# Patient Record
Sex: Female | Born: 1955 | Race: White | Hispanic: No | Marital: Married | Smoking: Never smoker
Health system: Southern US, Community
[De-identification: ages and names within clinical notes are randomized; demographics above are authoritative.]

## PROBLEM LIST (undated history)

## (undated) DIAGNOSIS — E119 Type 2 diabetes mellitus without complications: Secondary | ICD-10-CM

## (undated) DIAGNOSIS — I10 Essential (primary) hypertension: Secondary | ICD-10-CM

## (undated) DIAGNOSIS — Z992 Dependence on renal dialysis: Secondary | ICD-10-CM

## (undated) DIAGNOSIS — E13319 Other specified diabetes mellitus with unspecified diabetic retinopathy without macular edema: Secondary | ICD-10-CM

## (undated) DIAGNOSIS — S82891A Other fracture of right lower leg, initial encounter for closed fracture: Secondary | ICD-10-CM

## (undated) DIAGNOSIS — D631 Anemia in chronic kidney disease: Secondary | ICD-10-CM

## (undated) DIAGNOSIS — N189 Chronic kidney disease, unspecified: Secondary | ICD-10-CM

## (undated) DIAGNOSIS — N2581 Secondary hyperparathyroidism of renal origin: Secondary | ICD-10-CM

## (undated) DIAGNOSIS — N186 End stage renal disease: Secondary | ICD-10-CM

## (undated) DIAGNOSIS — L97519 Non-pressure chronic ulcer of other part of right foot with unspecified severity: Secondary | ICD-10-CM

## (undated) HISTORY — PX: ORIF ANKLE FRACTURE: SUR919

## (undated) HISTORY — PX: OTHER SURGICAL HISTORY: SHX169

---

## 2006-01-10 ENCOUNTER — Inpatient Hospital Stay (HOSPITAL_COMMUNITY): Admission: AD | Admit: 2006-01-10 | Discharge: 2006-01-20 | Payer: Self-pay | Admitting: Nephrology

## 2006-01-11 ENCOUNTER — Encounter (INDEPENDENT_AMBULATORY_CARE_PROVIDER_SITE_OTHER): Payer: Self-pay | Admitting: *Deleted

## 2006-08-31 ENCOUNTER — Ambulatory Visit (HOSPITAL_COMMUNITY): Admission: RE | Admit: 2006-08-31 | Discharge: 2006-08-31 | Payer: Self-pay | Admitting: Vascular Surgery

## 2006-08-31 HISTORY — PX: AV FISTULA PLACEMENT: SHX1204

## 2006-09-05 ENCOUNTER — Ambulatory Visit (HOSPITAL_COMMUNITY): Admission: RE | Admit: 2006-09-05 | Discharge: 2006-09-05 | Payer: Self-pay | Admitting: Vascular Surgery

## 2006-09-05 HISTORY — PX: ARTERIOVENOUS GRAFT PLACEMENT: SUR1029

## 2008-11-24 ENCOUNTER — Ambulatory Visit (HOSPITAL_COMMUNITY): Admission: RE | Admit: 2008-11-24 | Discharge: 2008-11-24 | Payer: Self-pay | Admitting: Nephrology

## 2011-04-28 NOTE — Discharge Summary (Signed)
Connie Christian, Connie Christian               ACCOUNT NO.:  [id number]   MEDICAL RECORD NO.:  [id number]          PATIENT TYPE:  INP   LOCATION:  5531                         FACILITY:  [facility]   PHYSICIAN:  Aram Beecham B. Eliott [name], M.D.DATE OF BIRTH:  June 03, 1956   DATE OF ADMISSION:  01/10/2006  DATE OF DISCHARGE:  01/20/2006                                 DISCHARGE SUMMARY   DISCHARGE DIAGNOSES:  1.  End-stage renal disease, new hemodialysis patient.  2.  Insulin-dependent diabetes mellitus.  3.  Hypertension.  4.  __________ .  5.  Right heel pressure ulcer with positive Staphylococcus aureus __________      .  6.  Anemia.   DISCHARGE MEDICATIONS:  1.  __________ b.i.d. with meals.  2.  Epogen 5000 units IV three times a week with hemodialysis on Tuesday,      Thursday, and Saturday.  3.  Hectorol 1 mcg IV with hemodialysis on Tuesday, Thursday, and Saturday.  4.  __________ 100 mg p.o. b.i.d. for seven days.  5.  Insulin 70/30 subcu, 30 units in the a.m. and 20 units in the evening      with meals.  6.  Venofer 50 mg IV weekly with hemodialysis, start on March 7th;  7.  Venofer 100 mg IV three times a week with hemodialysis on Tuesday,      Thursday, and Saturday; stop February 28th.  8.  Metoprolol 50 mg p.o. b.i.d.  9.  Nephro-Vite one tab p.o. daily.  10. Sorbitol 60 ml daily p.r.n.   DISCHARGE INSTRUCTIONS:  The patient needs wet-to-dry dressing changes in  the right heel daily. She was instructed to follow up at the [facility] on Tuesday, Thursday, and Saturday for hemodialysis, [phone].  Next appointment on Tuesday, January 23, 2006, at 10 a.m. She also was  instructed to follow up with Dr. Hart Rochester from [facility] on March 27th at 9:40  a.m., phone number [phone]. The patient was recommended to follow a  diabetic diet, carbohydrate modified, and renal diet with 7 gm of protein, 2-  gm sodium, 2-gm potassium, and fluid restriction to 1200 mL daily. The  patient is  also to have home health aide for dressing changes and for home  health PT/OT. Arrangements have been made prior to discharge for a social  worker to coordinate this at the [facility].   PROCEDURES AND STUDIES:  The patient had an IJ Diatek PermCath placed on the  right side on January 11, 2006. She had a new ABF placed on the left side on  January 17, 2006. The patient received hemodialysis treatment on February  2nd, February 3rd, February 5th, February 6th, February 8th, and February  10th. Chest x-ray on January 31st showed cardiomegaly and vascular  congestion with bilateral effusions. Preview x-ray of the right foot showed  chronic changes, extensive degenerative change of the ankle joint,  osteopenia, postoperative changes, and no evidence of osteomyelitis.  Renal  ultrasound on February 1st showed no hydronephrosis, increased cortical  echogenicity compatible with medial renal parenchymal disease. A 2-D  echocardiogram on February 1st showed  left ventricular systolic function  normal with ventricular ejection fraction between 55% and 60%. Abnormal  relaxation of the left ventricle, mild mitral valve regurgitation,  atrioseptal aneurysm visualized, right ventricle mildly dilated with a right  ventricular systolic pressure between 50% and 55%.  The inferior vena cava  was mildly dilated.  Chest x-ray on February 4th showed stable cardiomegaly  and improved bilateral effusion with no overt interstitial edema.   BRIEF HOSPITAL COURSE:  Connie Christian is a 55 year old female who was  transferred from [facility] on January 10, 2006, with volume  overload and anasarca, in renal failure. The patient had a history of poorly  controlled diabetes and hypertension. She was followed by Dr. Jeanie Sewer in  [place] with poor compliance with her medications. She also has a history  of ankle fracture which was repaired two years prior and with poor healing.  She had a creatinine of  1.7 in 2005. She presented on January 30th to  [facility] with large right pleural effusion and generalized  anasarca. BUN 142, creatinine 9.4, and a hemoglobin of 9.1. Albumin of 2.8.  She was transferred to [facility] for further management.  Problem #1:  Renal failure. The patient responded fairly to IV diuretics  given at [facility]. On the 31st she had a PermCath placed and she  started hemodialysis on February 1st. She had a total of seven hemodialysis  treatments in which she had a UF of 5 liters each time, tolerating the  hemodialysis very well. She had an arteriovenous fistula performed on the  left arm on February 7th. She clinically improved ostensibly with the  hemodialysis reducing her lower extremity edema from 3+ to 1+ edema on  discharge. She also cleared her lungs and on discharge day she had lungs  that were clear to auscultation bilaterally, with no crackles. The patient  was admitted with a weight of 87 kg and she was discharged with a weight of  66.6 kg, so down 20.4 kg during this hospital stay. She was admitted with  hypertension and she was started on Norvasc and metoprolol. Her Norvasc was  discontinued since her pressures began to improve with hemodialysis. She  continues to have increased systolic blood pressures, and she still has  volume overload. She had an estimated a direct wave and 63 kg, but needs to  be challenged lightly. She can go further down 63 kg. She is to follow up at  the [facility] for hemodialysis on Tuesday, February 13th, at  10 a.m.  Her hemodialysis orders are four hours via PermCath in the right  side 400/800 heparin, 4K with 2.5 calcium bath, UF 3-4 liters, and the  patient would need calcium weekly checks. The patient had __________ .   __________ hyperglycemic with glucose levels ranging to 300. Her insulin regime was upsized and she was discharged on 70/30, 30units in the morning  and 20 units in  the evening. __________ 88 to 97.  __________ the patient  was __________ with a hemoglobin of 8.5, __________ down to 7.9.  She  received two units of packed red blood cells on February 8th. On discharge,  the patient's hemoglobin was 11.1.  She was started on __________ and  continued to follow with a dose of 5000 units three times a week, on  Tuesday, Thursday, and Saturday. The patient had iron studies that showed  iron of 67, iron-binding capacity of 247, percent saturation of 27. She was  __________ .  Her hemoglobin is to be followed as an outpatient.  __________  end-stage renal disease. This is likely secondary to diabetes,  microangiography.   Right heel pressure sore. The patient was followed by wound care while she  was at the __________ Hospital. They recommended follow-up dressing changes.  Her __________ was altered and __________ started on vancomycin __________  b.i.d. She is to complete a total of 14 days __________   DISCHARGE LABORATORY:  White blood cell count 16.2, hemoglobin 11.1,  hematocrit 36.0, platelet count 147,000. Sodium 136, potassium 3.8, chloride  101, CO2 26, BUN 46, creatinine __________ , glucose 47, calcium 7.9,  phosphorus 2.9, albumin 2.   __________ right heel pressure sore. The patient , despite growing  Staphylococcus aureus in the right heel, she remained afebrile. However, the  last discharge day in the hospital she had a complete white blood count  trended down __________ . Also, __________ white blood cell count could be  related to __________ and a normal urinalysis. The patient __________ .   DISPOSITION:  The patient was discharged home in improved and stable  condition to follow up at the [facility] on Monday at 10 a.m.  for hemodialysis, phone numbers have been provided. All the pertinent  information needs to be faxed to the kidney center.      Sharin Grave, MD    ______________________________  Duke Salvia  Eliott [name], [name]    AM/MEDQ  D:  01/20/2006  T:  01/21/2006  Job:  [id number]   cc:   [facility]   Dr. Gwyneth Sprout, [employer]   Maree Krabbe, [name]  Fax: [phone]

## 2011-04-28 NOTE — Op Note (Signed)
Connie Christian, Connie Christian               ACCOUNT NO.:  [id number]   MEDICAL RECORD NO.:  [id number]           PATIENT TYPE:   LOCATION:                                 FACILITY:   PHYSICIAN:  Quita Skye. Hart Rochester, [name]       DATE OF BIRTH:   DATE OF PROCEDURE:  01/17/2006  DATE OF DISCHARGE:                                 OPERATIVE REPORT   PREOP DIAGNOSIS:  End-stage renal disease.   PO STOP DIAGNOSIS:  End-stage renal disease.   OPERATION:  Creation of a left brachial artery to cephalic vein upper arm AV  fistula (Kauffman shunt).   SURGEON:  Quita Skye. Hart Rochester, [name]   FIRST ASSISTANT:  Coral Ceo, P.A.   ANESTHESIA:  Local.   DESCRIPTION OF PROCEDURE:  The patient was taken to the operating room,  placed in the supine position, at which time the left upper extremity was  prepped with Betadine scrub and solution, and draped in a routine sterile  manner. After infiltration with 1% Xylocaine with epinephrine, a transverse  incision was made in the antecubital area and the antecubital vein dissected  free. The cephalic vein was mobilized, its branches ligated with 3-0 silk  ties, and divided. It was a 3-3.5 mm vein of good quality; and could be  dilated up to the shoulder level. Brachial artery was then exposed beneath  the fascia. There was an aberrant radial artery which was preserved arising  proximal to the antecubital area which had a good pulse as well. The  brachial artery was then occluded proximally and distally with vessel loops,  opened with a 15-blade and extended with the Potts scissors. There was  excellent inflow. The vein was carefully measured and spatulated and  anastomosed end-to-side with 6-0 Prolene. The vessel loops were then  released; and there was a palpable pulse and thrill in the fistula with  excellent Doppler flow. There was also good Doppler flow in the radial and  ulnar artery with the fistula open. Adequate hemostasis was achieved; and  the wound was  closed with Vicryl in a subcuticular fashion. Sterile dressing  applied. The patient taken to recovery in satisfactory condition.           ______________________________  Quita Skye Hart Rochester, [name]     JDL/MEDQ  D:  01/17/2006  T:  01/17/2006  Job:  [id number]

## 2011-04-28 NOTE — Op Note (Signed)
NAMETAMICO, MUNDO               ACCOUNT NO.:  [id number]   MEDICAL RECORD NO.:  [id number]          PATIENT TYPE:  AMB   LOCATION:  SDS                          FACILITY:  [facility]   PHYSICIAN:  Di Kindle. Edilia Bo, M.D.DATE OF BIRTH:  Nov 25, 1956   DATE OF PROCEDURE:  08/31/2006  DATE OF DISCHARGE:                                 OPERATIVE REPORT   PREOPERATIVE DIAGNOSIS:  Poorly functioning left upper arm arteriovenous  fistula.   POSTOPERATIVE DIAGNOSIS:  Poorly functioning left upper arm arteriovenous  fistula.   PROCEDURE:  Conversion of left upper arm arteriovenous fistula to a left  forearm arteriovenous graft.   SURGEON:  Di Kindle. Edilia Bo, [name]   ASSISTANT:  Constance Holster, P.A.-C.   ANESTHESIA:  Local with sedation.   TECHNIQUE:  The patient was taken to the operating room and sedated by  anesthesia.  The left upper extremity was prepped and draped in the usual  sterile fashion.  After the skin was anesthetized with 1% lidocaine, an  incision at the antecubital level was opened and the AV fistula was  dissected free.  The vein was small proximally and then became larger  further centrally.  The artery was dissected free above and below the  anastomosis.  Of note, the artery was somewhat small but had an excellent  pulse.  A 4-7 mm graft was then tunneled in a loop fashion in the forearm  using one distal counter incision.  The arterial aspect of the graft was on  the radial aspect of the forearm, the venous aspect of the graft was on the  ulnar aspect of the forearm, and the distal aspect of the venous end of the  graft crossed over the arterial end to get to the cephalic vein.  The  patient was heparinized.  The brachial artery was clipped proximally and  distally and the vein was removed from the artery.  The arteriotomy was not  extended, at all.  A 6 mm 4 x 7 graft was spatulated, leaving as much as I  could of the 4 mm end of the graft to try to prevent  steal. This was sewn  end-to-side to the brachial artery using continuous 6-0 Prolene suture.  The  graft was then pulled the appropriate length for anastomosis to the cephalic  vein.  The vein had been ligated distally and spatulated proximally graft.  The graft was cut at appropriate length, spatulated, and sewn end-to-end to  the vein using continuous 6-0 Prolene suture.  At completion, there was an  excellent thrill in the graft.  Hemostasis was obtained in the wounds.  The  wounds were closed with a deep layer of 3-0 Vicryl.  The skin was closed  with 4-0 Vicryl.  A sterile dressing was applied.  The patient tolerated the  procedure well and was transferred to the recovery room in satisfactory  condition.  All needle and sponge counts were correct.      Di Kindle. Edilia Bo, [name]  Electronically Signed     CSD/MEDQ  D:  08/31/2006  T:  09/02/2006  Job:  [id number] 

## 2011-04-28 NOTE — Op Note (Signed)
NAMEDANALY, Connie Christian               ACCOUNT NO.:  [id number]   MEDICAL RECORD NO.:  [id number]          PATIENT TYPE:  AMB   LOCATION:  SDS                          FACILITY:  [facility]   PHYSICIAN:  Larina Earthly, [name]    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   PROCEDURE:  Removal of left arm AV Gore-Tex graft.   SURGEON:  Dr. Tawanna Cooler Early   ASSISTANT:  Zadie Rhine, PA-C.   ANESTHESIA:  MAC.   COMPLICATIONS:  None.   DISPOSITION:  To recovery room stable.   INDICATION FOR PROCEDURE:  The patient is a 55 year old white female, who  had a poorly functioning left upper arm AV fistula.  On September 21, she  had placement of a left forearm loop graft.  This was a 4-7 mm tapered  graft.  The patient presented to our office the day prior to this procedure  in the afternoon with numbness, pain, and obvious steal syndrome of her left  hand.  She is taken to the operating room this morning for ligation and  probable removal.   PROCEDURE IN DETAIL:  The patient was taken to the operating room and placed  in the supine position where the area of the left arm prepped and draped in  the usual sterile fashion.  Using local anesthesia, the antecubital wound  was reopened.  The graft was sewn end-to-end to the cephalic vein, and the  vein was ligated, and the graft was occluded and removed from the vein.  The  graft was doubly ligated near the arterial anastomosis, transected near the  arterial anastomosis.  The small cuff of graft was left on the artery, and  otherwise the graft was removed in its entirety.  Wounds were irrigated with  saline, hemostasis electrocautery.  Wounds were closed with 3-0 Vicryl in  the  subcutaneous, subcuticular tissue.  Benzoin and Steri-Strips were  applied.      Larina Earthly, [name]  Electronically Signed     TFE/MEDQ  D:  09/05/2006  T:  09/06/2006  Job:  [id number]

## 2011-04-28 NOTE — H&P (Signed)
NAMECARYL, Christian NO.:  [id number]   MEDICAL RECORD NO.:  [id number]          PATIENT TYPE:  INP   LOCATION:  2110                         FACILITY:  [facility]   PHYSICIAN:  Aram Beecham B. Eliott [name], M.D.DATE OF BIRTH:  [birth year]   DATE OF ADMISSION:  01/10/2006  DATE OF DISCHARGE:                                HISTORY & PHYSICAL   HISTORY OF PRESENT ILLNESS:  This is a 55 year old white female who was  transferred here from [facility] with volume overload, anasarca, and  renal failure.  She has been followed over the years by Dr. Jeanie Christian in  [place] with a history of long-standing diabetes, poorly controlled by her  admission, hypertension, for which she did not take medication on a regular  basis, and a history of a right ankle fracture (Dr. Thedore Mins).  The only  creatinine data we have is from 2005, when serum creatinine was reportedly  1.7.  She presented to [facility] on January 09, 2006, with several  weeks of swelling, a large necrotic right heel ulcer, a large right pleural  effusion, and generalized anasarca.  Her labs were notable for a BUN of 142,  creatinine of 9.4, hemoglobin of 9.1, albumin of 2.8, and a bicarbonate of  12.  Chest x-ray shows a large right pleural effusion.  Because of her renal  failure, she was transferred here for further management.  She did receive  IV Lasix every six hours while she was at [facility], although urine  outputs are not available.  Apparently, no renal ultrasound was done.   The patient herself says she has been swelling only for a couple of weeks.  She had some nausea and vomiting a few weeks back that she attributed to an  inner ear problem, but says that this has resolved and her appetite has been  good.  She has not noticed a change in urine output, no dysuria, hematuria,  back pain, or flank pain.  She reports no episodes of hypoglycemia on her  usual doses of 12 to 20 units of 70/30 insulin,  but it should be noted  that she does not regularly check her blood sugars.  She has had significant  dyspnea on exertion and swelling of her lower extremities and abdomen.  She  denies chest pain.  She has had no other GI symptoms.   PAST MEDICAL HISTORY:  1.  IDDM with retinopathy, status post laser therapy (the patient admits to      poor compliance and not regularly checking blood sugars).  2.  Hypertension, long-standing.  She had medications prescribed at one      point in the past, but did not take them because they did not agree with      her.  3.  History of right ankle fracture with poor healing (Dr. Thedore Mins).  4.  History of anemia with prior transfusion in the past (around the time of      menopause;  she states that she had a negative colonoscopy about that      time).  5.  Poor medical compliance.  6.  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  7.  Vancomycin allergy (blistering skin reaction), and penicillin allergy      (rash).  8.  History of pneumonia.   OUTPATIENT MEDICATIONS PRIOR TO ADMISSION:  1.  Insulin 70/30 12 to 20 units b.i.d.  2.  Benadryl p.r.n.   MEDICATIONS AT TIME OF TRANSFER:  1.  Sliding scale insulin.  2.  Rocephin 1 g q.24h.  3.  Lasix 100 mg q.6h.  4.  Metoprolol 25 mg p.o. b.i.d.  5.  Tylenol p.r.n.  6.  Phenergan p.r.n.  7.  Alprazolam p.r.n.  8.  Laxative of choice p.r.n.   FAMILY HISTORY:  Positive for diabetes.  Negative for hypertension, coronary  disease, or renal failure.   SOCIAL HISTORY:  The patient is unmarried and lives with her mother in  [place], [place].  She has no children.  She does not smoke or drink.  She has  been transfused in the past.   REVIEW OF SYSTEMS:  As per history of present illness.   PHYSICAL EXAMINATION:  GENERAL:  She is a very pale, pasty-appearing, white  female who is mildly dyspneic.  She has generalized anasarca.  VITAL SIGNS:  Blood  pressure 140/76, heart rate 67.  She is in sinus rhythm.  O2 saturation 98% on room air.  HEENT:  Pupils equal, round, reactive to light and accommodation.  NECK:  JVP approximately 6 cm.  LUNGS:  Breath sounds were markedly diminished, and in fact, could only be  heard at the apex on the right chest which was dull to percussion all the  way up to the scapula.  Breath sounds on the left were clear, but were  diminished at the base.  CARDIAC:  S1 and S2.  No S3, no pericardial rub.  ABDOMEN:  Positive bowel sounds.  There was no tenderness and no palpable  organomegaly.  She did have pitting edema of the abdominal wall.  EXTREMITIES:  4+ edema.  There was a necrotic right heel ulcer on the right  foot.  She had a deformed right ankle from her previous fracture.  Pulses  were trace dorsalis pedis and I could not feel posterior tibial's.  There  was a weeping skin ____________on the right leg.  No asterixis.   LABORATORY DATA:  X-rays:  EKG, etc., are all pending from this admission.  From [facility], pertinent labs included BUN of 147, creatinine 9.4,  potassium 5.7, hemoglobin 9.1, WBC 12,000.  Chest x-ray shows a large right  pleural effusion.   IMPRESSION:  A 55 year old white female with a history of poorly controlled  diabetes and hypertension with:  1.  Renal failure:  Creatinine was 1.7 in 2005.  There is no interim data.      This may simply represent the natural progression of disease.  There is      no history of any prior workup.  She needs dialysis and ultrafiltration      as we look for potential reversibility.  Our plan will be to start her      on intravenous diuretics tonight, have Perm-Cath placed in the morning      for dialysis and ultrafiltration, check urinalysis, urine protein-to-      creatinine ratio, renal ultrasound, SPEP, and UPEP.  We will save her     non-dominant arm, map her veins for possible AV access in  the future,      and place a Foley  catheter.  2.  Insulin-dependent diabetes mellitus:  Continue insulin and Accu-Chek's.  3.  Hypertension:  Continue metoprolol.  4.  Necrotic heel ulcer:  We will check a plain x-ray to rule out      osteomyelitis and check Doppler's to evaluate the adequacy of her      circulation.  We will use quinolone antibiotics empirically to cover      this as she is allergic to vancomycin and to penicillin.  5.  Anemia:  We will check iron studies, and pending those results, she will      likely need Aranesp or Epogen plus or minus iron.  6.  Large right pleural effusion:  This may require thoracentesis, but      hopefully with volume removal on dialysis,      this will diminish.  7.  Generalized anasarca:  I suspect this is all just volume overload      secondary to renal failure, but we will check a 2-D echocardiogram to      evaluate left ventricular function.           ______________________________  Duke Salvia Eliott [name], [name]     CBD/MEDQ  D:  01/10/2006  T:  01/10/2006  Job:  [id number]

## 2011-04-28 NOTE — Op Note (Signed)
Connie Christian, Connie Christian               ACCOUNT NO.:  [id number]   MEDICAL RECORD NO.:  [id number]          PATIENT TYPE:  INP   LOCATION:  5531                         FACILITY:  [facility]   PHYSICIAN:  Janetta Hora. Fields, MD  DATE OF BIRTH:  07/15/1956   DATE OF PROCEDURE:  01/11/2006  DATE OF DISCHARGE:                                 OPERATIVE REPORT   PROCEDURE:  Right neck ultrasound with insertion of right internal jugular  vein Diatek catheter.   PREOPERATIVE DIAGNOSIS:  Renal failure.   POSTOPERATIVE DIAGNOSIS:  Renal failure.   ANESTHESIA:  Local with IV sedation.   ASSISTANT:  Nurse.   OPERATIVE FINDINGS:  A 23-cm Diatek catheter, right internal jugular vein.   OPERATIVE DETAILS:  After obtaining informed consent, the patient was taken  to the operating room.  The patient was placed in supine position on the  operating table.  After adequate sedation, the patient's neck was inspected  with the Site-Rite ultrasound.  The right internal jugular vein was found be  widely patent with normal compression and respiratory variation.  Next the  patient's entire neck and chest were prepped and draped int he usual sterile  fashion.  Local anesthesia was infiltrated over the right internal jugular  vein.  Ultrasound guidance was used to cannulate the right internal jugular  vein.  A 0.035 J-tip guidewire was then threaded through the right internal  jugular vein down to the inferior vena cava under fluoroscopic guidance.  Next sequential 12, 14 and 16-French dilators wee placed over the guidewire  in the right atrium.  A 16-French with dilator peel-away sheath was placed  over the guidewire in the right atrium.  The dilator and guidewire were  removed and a 23-cm Diatek catheter was threaded through the peel-away  sheath down to the right atrium. The peel-away sheath was then removed.  The  catheter was tunneled subcutaneously, cut to length and the hub attached.  The catheter was noted  to flush and draw easily.  The catheter was inspected  under fluoroscopy, found with its tip in the right atrium and no kinks  throughout its course.  The catheter sutured to the skin with nylon sutures.  The neck insertion site was closed with a Vicryl stitch.  The patient  tolerated the procedure well, and there were no complications.  Instrument,  sponge and needle count were correct at the end of the  case.  The patient  was taken to the recovery room in stable condition.           ______________________________  Janetta Hora Fields, MD     CEF/MEDQ  D:  01/11/2006  T:  01/11/2006  Job:  [id number]

## 2012-11-05 ENCOUNTER — Encounter (HOSPITAL_COMMUNITY): Payer: Self-pay

## 2012-11-05 ENCOUNTER — Other Ambulatory Visit (HOSPITAL_COMMUNITY): Payer: Self-pay | Admitting: Nephrology

## 2012-11-05 ENCOUNTER — Ambulatory Visit (HOSPITAL_COMMUNITY)
Admission: RE | Admit: 2012-11-05 | Discharge: 2012-11-05 | Disposition: A | Discharge status: Home / Self Care | Payer: Medicare Other | Source: Ambulatory Visit | Attending: Nephrology | Admitting: Nephrology

## 2012-11-05 DIAGNOSIS — D631 Anemia in chronic kidney disease: Secondary | ICD-10-CM | POA: Insufficient documentation

## 2012-11-05 DIAGNOSIS — N186 End stage renal disease: Secondary | ICD-10-CM

## 2012-11-05 DIAGNOSIS — I120 Hypertensive chronic kidney disease with stage 5 chronic kidney disease or end stage renal disease: Secondary | ICD-10-CM | POA: Insufficient documentation

## 2012-11-05 DIAGNOSIS — N039 Chronic nephritic syndrome with unspecified morphologic changes: Secondary | ICD-10-CM | POA: Insufficient documentation

## 2012-11-05 DIAGNOSIS — N2581 Secondary hyperparathyroidism of renal origin: Secondary | ICD-10-CM | POA: Insufficient documentation

## 2012-11-05 DIAGNOSIS — E119 Type 2 diabetes mellitus without complications: Secondary | ICD-10-CM | POA: Insufficient documentation

## 2012-11-05 DIAGNOSIS — Z4901 Encounter for fitting and adjustment of extracorporeal dialysis catheter: Secondary | ICD-10-CM | POA: Insufficient documentation

## 2012-11-05 HISTORY — DX: Essential (primary) hypertension: I10

## 2012-11-05 HISTORY — DX: Anemia in chronic kidney disease: D63.1

## 2012-11-05 HISTORY — DX: Secondary hyperparathyroidism of renal origin: N25.81

## 2012-11-05 HISTORY — DX: Dependence on renal dialysis: Z99.2

## 2012-11-05 HISTORY — DX: Chronic kidney disease, unspecified: N18.9

## 2012-11-05 HISTORY — DX: Non-pressure chronic ulcer of other part of right foot with unspecified severity: L97.519

## 2012-11-05 HISTORY — DX: Type 2 diabetes mellitus without complications: E11.9

## 2012-11-05 HISTORY — DX: Other fracture of right lower leg, initial encounter for closed fracture: S82.891A

## 2012-11-05 HISTORY — DX: Other specified diabetes mellitus with unspecified diabetic retinopathy without macular edema: E13.319

## 2012-11-05 HISTORY — DX: End stage renal disease: N18.6

## 2012-11-05 MED ORDER — CLINDAMYCIN PHOSPHATE 600 MG/50ML IV SOLN
600.0000 mg | Freq: Once | INTRAVENOUS | Status: AC
  Administered 2012-11-05: 600 mg via INTRAVENOUS
  Filled 2012-11-05: qty 50

## 2012-11-05 MED ORDER — CHLORHEXIDINE GLUCONATE 4 % EX LIQD
CUTANEOUS | Status: AC
  Filled 2012-11-05: qty 45

## 2012-11-05 MED ORDER — HEPARIN SODIUM (PORCINE) 1000 UNIT/ML IJ SOLN
INTRAMUSCULAR | Status: AC
  Filled 2012-11-05: qty 1

## 2012-11-05 NOTE — H&P (Signed)
Connie Christian is an 56 y.o. female.   Chief Complaint: cuff exposed to HD catheter noted today at HD. HPI: ESRD in patient who refuses other access after failure of left arm AVF - converted to AVG - then removed ultimately in 2007 secondary to motor and sensory disturbances to her left fingers. Existing catheter placed at [facility] on 10/01/12 secondary to broken hardware.   Past Medical History  Diagnosis Date  . ESRD (end stage renal disease) on dialysis     started 12/2005  . Hypertension   . DM (diabetes mellitus)   . Anemia of chronic kidney failure   . Hyperparathyroidism due to renal insufficiency   . Closed right ankle fracture   . Ulcer of right foot   . 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 Orif ankle fracture     right  . I& d foot ulcer     right 2007  . Cataracts     bilateral  . Av fistula placement 08/31/06    left arm - removed after conversion to AVG failed  . Arteriovenous graft placement 09/05/06    removed 09/05/06 - nonmatured  . Hemodialysis catheters 06/16/10, 10/01/12, 11/05/12    patient refuses other access than catheters    Social History:  does not have a smoking history on file. She does not have any smokeless tobacco history on file. Her alcohol and drug histories not on file.  Allergies:  Allergies  Allergen Reactions  . Amoxicillin Itching and Rash  . Ciprofloxacin Itching and Rash  . Daptomycin Itching and Rash  . Iron Dextran     hypotension  . Penicillins Itching and Rash  . Vancomycin Itching, Rash and Other (See Comments)    Pt states that this medications caused her "skin to peel"     Review of Systems  Constitutional: Negative for fever, chills and weight loss.  Eyes: Positive for blurred vision.  Respiratory: Negative.   Cardiovascular: Negative for chest pain, palpitations and leg swelling.  Gastrointestinal: Negative.   Genitourinary:       ESRD   Musculoskeletal: Negative.     Neurological: Negative.   Endo/Heme/Allergies: Bruises/bleeds easily.  Psychiatric/Behavioral: Negative.     Blood pressure 180/71, pulse 79, temperature 98.8 F (37.1 C), temperature source Oral, resp. rate 16, SpO2 94.00%. Physical Exam  Constitutional: She is oriented to person, place, and time. She appears well-developed and well-nourished. No distress.  HENT:  Head: Normocephalic and atraumatic.  Eyes: Pupils are equal, round, and reactive to light.  Cardiovascular: Normal rate, regular rhythm and normal heart sounds.  Exam reveals no gallop and no friction rub.   No murmur heard. Respiratory: Effort normal and breath sounds normal. No respiratory distress. She has no wheezes. She has no rales.       Left chest HD catheter intact with obvious exposed cuff   GI: Soft.  Musculoskeletal: Normal range of motion. She exhibits no edema and no tenderness.  Neurological: She is alert and oriented to person, place, and time.  Skin: Skin is warm and dry.  Psychiatric: She has a normal mood and affect. Her behavior is normal. Judgment and thought content normal.     Assessment/Plan Patient in need of exchange of her HD catheter due to exposed cuff. Patient states she had a significant amount of bleeding after catheter placement which may have kept the cuff from scarring adequately. Procedure for catheter exchange discussed in detail  with patient with her apparent understanding. Written consent obtained. Pharmacy assisted on antibiotic coverage for this patient given multiple antibiotic allergies. Written consent obtained.   CAMPBELL,PAMELA D 11/05/2012, 3:20 PM

## 2012-11-05 NOTE — Procedures (Signed)
Successful exchange of left jugular dialysis catheter.  Tip at SVC/RA junction.  No immediate complication.

## 2012-11-08 ENCOUNTER — Telehealth (HOSPITAL_COMMUNITY): Payer: Self-pay

## 2013-03-28 ENCOUNTER — Inpatient Hospital Stay (HOSPITAL_COMMUNITY)
Admission: AD | Admit: 2013-03-28 | Discharge: 2013-04-06 | DRG: 186 | Disposition: A | Discharge status: Home / Self Care | Payer: Medicare Other | Source: Other Acute Inpatient Hospital | Attending: Internal Medicine | Admitting: Internal Medicine

## 2013-03-28 ENCOUNTER — Encounter (HOSPITAL_COMMUNITY): Payer: Self-pay | Admitting: Family Medicine

## 2013-03-28 DIAGNOSIS — Z794 Long term (current) use of insulin: Secondary | ICD-10-CM

## 2013-03-28 DIAGNOSIS — N2581 Secondary hyperparathyroidism of renal origin: Secondary | ICD-10-CM | POA: Diagnosis present

## 2013-03-28 DIAGNOSIS — E1139 Type 2 diabetes mellitus with other diabetic ophthalmic complication: Secondary | ICD-10-CM | POA: Diagnosis present

## 2013-03-28 DIAGNOSIS — D631 Anemia in chronic kidney disease: Secondary | ICD-10-CM | POA: Diagnosis present

## 2013-03-28 DIAGNOSIS — I898 Other specified noninfective disorders of lymphatic vessels and lymph nodes: Secondary | ICD-10-CM | POA: Diagnosis present

## 2013-03-28 DIAGNOSIS — H1130 Conjunctival hemorrhage, unspecified eye: Secondary | ICD-10-CM | POA: Diagnosis present

## 2013-03-28 DIAGNOSIS — H44819 Hemophthalmos, unspecified eye: Secondary | ICD-10-CM

## 2013-03-28 DIAGNOSIS — Z992 Dependence on renal dialysis: Secondary | ICD-10-CM

## 2013-03-28 DIAGNOSIS — I120 Hypertensive chronic kidney disease with stage 5 chronic kidney disease or end stage renal disease: Secondary | ICD-10-CM | POA: Diagnosis present

## 2013-03-28 DIAGNOSIS — Z881 Allergy status to other antibiotic agents status: Secondary | ICD-10-CM

## 2013-03-28 DIAGNOSIS — R296 Repeated falls: Secondary | ICD-10-CM | POA: Diagnosis present

## 2013-03-28 DIAGNOSIS — E11319 Type 2 diabetes mellitus with unspecified diabetic retinopathy without macular edema: Secondary | ICD-10-CM | POA: Diagnosis present

## 2013-03-28 DIAGNOSIS — Z823 Family history of stroke: Secondary | ICD-10-CM

## 2013-03-28 DIAGNOSIS — Z79899 Other long term (current) drug therapy: Secondary | ICD-10-CM

## 2013-03-28 DIAGNOSIS — N186 End stage renal disease: Secondary | ICD-10-CM | POA: Diagnosis present

## 2013-03-28 DIAGNOSIS — R0902 Hypoxemia: Secondary | ICD-10-CM | POA: Diagnosis present

## 2013-03-28 DIAGNOSIS — E119 Type 2 diabetes mellitus without complications: Secondary | ICD-10-CM

## 2013-03-28 DIAGNOSIS — E1169 Type 2 diabetes mellitus with other specified complication: Secondary | ICD-10-CM | POA: Diagnosis present

## 2013-03-28 DIAGNOSIS — J90 Pleural effusion, not elsewhere classified: Secondary | ICD-10-CM

## 2013-03-28 DIAGNOSIS — Y92009 Unspecified place in unspecified non-institutional (private) residence as the place of occurrence of the external cause: Secondary | ICD-10-CM

## 2013-03-28 DIAGNOSIS — Z880 Allergy status to penicillin: Secondary | ICD-10-CM

## 2013-03-28 MED ORDER — SODIUM CHLORIDE 0.9 % IJ SOLN
3.0000 mL | Freq: Two times a day (BID) | INTRAMUSCULAR | Status: DC
  Administered 2013-03-28 – 2013-04-05 (×15): 3 mL via INTRAVENOUS

## 2013-03-28 MED ORDER — TROPICAMIDE 1 % OP SOLN
1.0000 [drp] | Freq: Once | OPHTHALMIC | Status: AC
  Administered 2013-03-28: 1 [drp] via OPHTHALMIC
  Filled 2013-03-28: qty 2

## 2013-03-28 MED ORDER — HYDRALAZINE HCL 20 MG/ML IJ SOLN
10.0000 mg | Freq: Four times a day (QID) | INTRAMUSCULAR | Status: DC | PRN
  Administered 2013-03-28 – 2013-03-31 (×6): 10 mg via INTRAVENOUS
  Filled 2013-03-28 (×6): qty 1

## 2013-03-28 NOTE — H&P (Signed)
[facility] History and Physical  Connie Christian OZH:[id number] DOB: 1956-02-02 DOA: 03/28/2013  Referring physician: [facility] PCP: No primary provider on file.  Specialists: Nephrology-Dr. Hyman Hopes  Opthalmology  Chief Complaint: Fall and facial trauma  HPI: Connie Christian is a 57 y.o. female was fine until this am when she got up.  She felt a little lightheaded and has a fall onto the nightstandand noticed a some pain.  She rememebrs getting up off the floor.  Her mother was present-she tried to get going walking and she wen tto the Ed in [place] about 09:00 am. She says she felt "weak"  She states her dry weight was 175 pounds-she was on the machine yesterday for 3 hours and 45 minutes. She's not sure how much fluid he pulled but has not had any shortness of breath weakness cough cold or phlegm production. He does relate that recently she had diarrheal illness which has resolved. She has no abdominal pain no chest pain no weakness on any one side of body. She does endorse difficulty seeing. This is new subsequent to injury sustained this morning.  Patient had a chest x-ray at emergency department at [facility] showing atelectasis or infiltrate right lower lobe with elevation right hemidiaphragm with no pulmonary edema  CT of the head showed no acute intracranial abnormalities mild cerebral atrophy it did also show high density material within the" highly suspicious for intraocular hemorrhage with linear irregular high-density material posteriorly suspicion was for retinal detachment.  Lens was not Id'd CT chest performed showed large right pleural effusion with significant compressive atelectasis no obvious obstructing mass or endobronchial lesion indeterminate 3 cm right adrenal gland lesion VQ scan performed showed low probability of acute pulmonary embolus. BUN was 27/5.0 hemoglobin 11.2   Review of Systems: The patient denies any other findings on 12 system  coronary  Past Medical History  Diagnosis Date  . ESRD (end stage renal disease) on dialysis     started 12/2005  . Hypertension   . DM (diabetes mellitus)   . Anemia of chronic kidney failure   . Hyperparathyroidism due to renal insufficiency   . Closed right ankle fracture   . Ulcer of right foot   . 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 Orif ankle fracture      right  . I& d foot ulcer      right 2007  . Cataracts      bilateral  . Av fistula placement  08/31/06    left arm - removed after conversion to AVG failed  . Arteriovenous graft placement  09/05/06    removed 09/05/06 - nonmatured  . Hemodialysis catheters  06/16/10, 10/01/12, 11/05/12    patient refuses other access than catheters   Social History:  has no tobacco, alcohol, and drug history on file. Patient lives at home with her mother  Allergies  Allergen Reactions  . Amoxicillin Itching and Rash  . Ciprofloxacin Itching and Rash  . Daptomycin Itching and Rash  . Iron Dextran     hypotension  . Penicillins Itching and Rash  . Vancomycin Itching, Rash and Other (See Comments)    Pt states that this medications caused her "skin to peel"    Family History  Problem Relation Age of Onset  . Stroke Mother     age 67  Prior to Admission medications   Not on File   Physical Exam: There were no vitals filed for this visit.   General:  NAD  eyes: 5-6 mm dilatation of the R eye. Contusion over eyelid Light response present.  Can track my finger but her nasal fiedl of vision is impaiored and she cannot count finger to direct confrontation.  Opthalmological exam was deferred  ENT: supple soft  Neck: soft non swollen  Cardiovascular: S1-S2 no murmur rub or  Respiratory: Medically clear no added sound appreciable tactile vocal resonance and  Abdomen: Soft nontender  nondistended  Skin: See above  Musculoskeletal: Range of motion  Psychiatric: Euthymic-incidental confused  Neurologic: Grossly  Labs on Admission:  Basic Metabolic Panel: No results found for this basename: NA, K, CL, CO2, GLUCOSE, BUN, CREATININE, CALCIUM, MG, PHOS,  in the last 168 hours Liver Function Tests: No results found for this basename: AST, ALT, ALKPHOS, BILITOT, PROT, ALBUMIN,  in the last 168 hours No results found for this basename: LIPASE, AMYLASE,  in the last 168 hours No results found for this basename: AMMONIA,  in the last 168 hours CBC: No results found for this basename: WBC, NEUTROABS, HGB, HCT, MCV, PLT,  in the last 168 hours Cardiac Enzymes: No results found for this basename: CKTOTAL, CKMB, CKMBINDEX, TROPONINI,  in the last 168 hours  BNP (last 3 results) No results found for this basename: PROBNP,  in the last 8760 hours CBG: No results found for this basename: GLUCAP,  in the last 168 hours  Radiological Exams on Admission: No results found.  EKG: Independently reviewed. None performed  Assessment/Plan Active Problems:   * No active hospital problems. *   1. Intraocular hemorrhage-ophthalmologist Dr. Gwen Pounds is aware of patient and will be seeing the patient shortly 2. Hypoxia-unclear etiology. Has large pleural effusion based on CT scan. This seems to have some compressive atelectasis. Will dialyze and repeat chest x-ray in the morning. If there does appear to be further fluid will need to consider arthrocentesis. Patient hemodynamically stable and has nausea requirements at this time leading me to think that this probably flash pulmonary edema 3. End-stage renal disease-I've contacted nephrology ready to arrange dialysis and need for the same  4. Should hypertension patient's medications have not been reconsult but seem to include felodipine 10 mg each bedtime. 5. Diabetes mellitus patient is on 7030 on a sliding scale and this will need to  be continued 6. Metabolic bone disease continue calcium carbonate and Sensipar 1 and orders  See above if consultant consulted, please document name and whether formally or informally consulted   Time spent: 33  Mahala Menghini [facility] Pager [phone]  If 7PM-7AM, please contact night-coverage [email or web address] Password [facility] 03/28/2013, 6:00 PM

## 2013-03-29 ENCOUNTER — Inpatient Hospital Stay (HOSPITAL_COMMUNITY): Payer: Medicare Other

## 2013-03-29 ENCOUNTER — Encounter (HOSPITAL_COMMUNITY): Payer: Self-pay | Admitting: Family Medicine

## 2013-03-29 LAB — COMPREHENSIVE METABOLIC PANEL
ALT: 22 U/L (ref 0–35)
AST: 21 U/L (ref 0–37)
Alkaline Phosphatase: 171 U/L — ABNORMAL HIGH (ref 39–117)
BUN: 33 mg/dL — ABNORMAL HIGH (ref 6–23)
CO2: 21 mEq/L (ref 19–32)
Chloride: 97 mEq/L (ref 96–112)
Creatinine, Ser: 6.67 mg/dL — ABNORMAL HIGH (ref 0.50–1.10)
Glucose, Bld: 205 mg/dL — ABNORMAL HIGH (ref 70–99)
Total Protein: 7 g/dL (ref 6.0–8.3)

## 2013-03-29 LAB — CBC
HCT: 34.9 % — ABNORMAL LOW (ref 36.0–46.0)
Hemoglobin: 11.1 g/dL — ABNORMAL LOW (ref 12.0–15.0)
MCH: 31.4 pg (ref 26.0–34.0)
MCHC: 31.8 g/dL (ref 30.0–36.0)
MCV: 98.6 fL (ref 78.0–100.0)
RDW: 14.8 % (ref 11.5–15.5)

## 2013-03-29 LAB — GLUCOSE, CAPILLARY
Glucose-Capillary: 277 mg/dL — ABNORMAL HIGH (ref 70–99)
Glucose-Capillary: 137 mg/dL — ABNORMAL HIGH (ref 70–99)

## 2013-03-29 MED ORDER — ATROPINE SULFATE 1 % OP SOLN
1.0000 [drp] | Freq: Three times a day (TID) | OPHTHALMIC | Status: DC
  Administered 2013-03-29 – 2013-04-06 (×25): 1 [drp] via OPHTHALMIC
  Filled 2013-03-29: qty 2

## 2013-03-29 MED ORDER — GATIFLOXACIN 0.5 % OP SOLN
1.0000 [drp] | Freq: Four times a day (QID) | OPHTHALMIC | Status: DC
  Administered 2013-03-29 – 2013-04-06 (×35): 1 [drp] via OPHTHALMIC
  Filled 2013-03-29: qty 2.5

## 2013-03-29 MED ORDER — DOXERCALCIFEROL 4 MCG/2ML IV SOLN
INTRAVENOUS | Status: AC
  Administered 2013-03-29: 8 ug via INTRAVENOUS
  Filled 2013-03-29: qty 4

## 2013-03-29 MED ORDER — LIDOCAINE HCL (PF) 1 % IJ SOLN: 5.0000 mL | INTRAMUSCULAR | Status: DC | PRN

## 2013-03-29 MED ORDER — CALCIUM CARBONATE ANTACID 500 MG PO CHEW
400.0000 mg | CHEWABLE_TABLET | Freq: Three times a day (TID) | ORAL | Status: DC
  Administered 2013-03-29 – 2013-04-02 (×12): 400 mg via ORAL
  Filled 2013-03-29 (×18): qty 2

## 2013-03-29 MED ORDER — ADULT MULTIVITAMIN W/MINERALS CH
1.0000 | ORAL_TABLET | Freq: Every day | ORAL | Status: DC
  Administered 2013-03-29 – 2013-04-06 (×9): 1 via ORAL
  Filled 2013-03-29 (×9): qty 1

## 2013-03-29 MED ORDER — CINACALCET HCL 30 MG PO TABS
30.0000 mg | ORAL_TABLET | Freq: Every day | ORAL | Status: DC
  Administered 2013-03-30 – 2013-04-06 (×8): 30 mg via ORAL
  Filled 2013-03-29 (×9): qty 1

## 2013-03-29 MED ORDER — CALCIUM CARBONATE ANTACID 500 MG PO CHEW
3.0000 | CHEWABLE_TABLET | Freq: Two times a day (BID) | ORAL | Status: DC | PRN
Start: 2013-03-29 — End: 2013-03-29

## 2013-03-29 MED ORDER — DOXERCALCIFEROL 4 MCG/2ML IV SOLN
8.0000 ug | INTRAVENOUS | Status: DC
  Filled 2013-03-29 (×2): qty 4

## 2013-03-29 MED ORDER — PENTAFLUOROPROP-TETRAFLUOROETH EX AERO: 1.0000 "application " | INHALATION_SPRAY | CUTANEOUS | Status: DC | PRN

## 2013-03-29 MED ORDER — TETRACAINE HCL 0.5 % OP SOLN
1.0000 [drp] | Freq: Once | OPHTHALMIC | Status: AC
  Administered 2013-03-29: 1 [drp] via OPHTHALMIC
  Filled 2013-03-29: qty 2

## 2013-03-29 MED ORDER — PREDNISOLONE ACETATE 1 % OP SUSP
1.0000 [drp] | Freq: Four times a day (QID) | OPHTHALMIC | Status: DC
  Administered 2013-03-29 – 2013-04-06 (×35): 1 [drp] via OPHTHALMIC
  Filled 2013-03-29: qty 5

## 2013-03-29 MED ORDER — LANTHANUM CARBONATE 500 MG PO CHEW
3000.0000 mg | CHEWABLE_TABLET | Freq: Three times a day (TID) | ORAL | Status: DC
  Administered 2013-03-29: 3000 mg via ORAL
  Filled 2013-03-29 (×3): qty 6

## 2013-03-29 MED ORDER — HYDROCERIN EX CREA
TOPICAL_CREAM | CUTANEOUS | Status: DC | PRN
  Filled 2013-03-29: qty 113

## 2013-03-29 MED ORDER — SODIUM CHLORIDE 0.9 % IV SOLN: 100.0000 mL | INTRAVENOUS | Status: DC | PRN

## 2013-03-29 MED ORDER — SODIUM CHLORIDE 0.9 % IV SOLN
125.0000 mg | INTRAVENOUS | Status: AC
  Administered 2013-03-29 – 2013-04-01 (×2): 125 mg via INTRAVENOUS
  Filled 2013-03-29 (×2): qty 10

## 2013-03-29 MED ORDER — LANTHANUM CARBONATE 500 MG PO CHEW: 3000.0000 mg | CHEWABLE_TABLET | Freq: Three times a day (TID) | ORAL | Status: DC

## 2013-03-29 MED ORDER — ALTEPLASE 2 MG IJ SOLR
2.0000 mg | Freq: Once | INTRAMUSCULAR | Status: DC | PRN
  Filled 2013-03-29: qty 2

## 2013-03-29 MED ORDER — LANTHANUM CARBONATE 500 MG PO CHEW
1500.0000 mg | CHEWABLE_TABLET | Freq: Three times a day (TID) | ORAL | Status: DC
  Administered 2013-03-29 – 2013-04-06 (×18): 1500 mg via ORAL
  Filled 2013-03-29 (×26): qty 3

## 2013-03-29 MED ORDER — NEPRO/CARBSTEADY PO LIQD
237.0000 mL | ORAL | Status: DC | PRN
  Filled 2013-03-29: qty 237

## 2013-03-29 MED ORDER — LIDOCAINE-PRILOCAINE 2.5-2.5 % EX CREA
1.0000 "application " | TOPICAL_CREAM | CUTANEOUS | Status: DC | PRN
  Filled 2013-03-29: qty 5

## 2013-03-29 MED ORDER — CALCIUM CARBONATE ANTACID 500 MG PO CHEW: 3.0000 | CHEWABLE_TABLET | Freq: Three times a day (TID) | ORAL | Status: DC

## 2013-03-29 MED ORDER — INSULIN ASPART PROT & ASPART (70-30 MIX) 100 UNIT/ML ~~LOC~~ SUSP
20.0000 [IU] | Freq: Two times a day (BID) | SUBCUTANEOUS | Status: DC
  Administered 2013-03-29 – 2013-04-01 (×7): 20 [IU] via SUBCUTANEOUS
  Filled 2013-03-29: qty 10

## 2013-03-29 MED ORDER — EUCERIN EX CREA: 1.0000 "application " | TOPICAL_CREAM | CUTANEOUS | Status: DC | PRN

## 2013-03-29 MED ORDER — TOBRAMYCIN 0.3 % OP SOLN
1.0000 [drp] | Freq: Four times a day (QID) | OPHTHALMIC | Status: DC
  Administered 2013-03-29 – 2013-04-06 (×35): 1 [drp] via OPHTHALMIC
  Filled 2013-03-29: qty 5

## 2013-03-29 MED ORDER — HEPARIN SODIUM (PORCINE) 1000 UNIT/ML DIALYSIS: 1000.0000 [IU] | INTRAMUSCULAR | Status: DC | PRN

## 2013-03-29 NOTE — Procedures (Signed)
I was present at this dialysis session. I have reviewed the session itself and made appropriate changes.   Vinson Moselle, MD [employer] 03/29/2013, 2:57 PM

## 2013-03-29 NOTE — Consult Note (Addendum)
57 yo female with long history of diabetes and multiple laser treatments to both eyes for diabetic retinopathy.Has had cataract surgery in both eyes also.Right eye has not had very good vision for several years, but she thinks that she could see the big letters on the wall at her last eye exam, but not much more.  She could see the 20/40 line with her left eye on her last exam. She has had only cataract surgeries, PRP and focal laser treatments and some injections for swelling(likely steroid or Avastin injections for macular edema) Larey Seat today and hit right eye on nightstand and got an abrasion on right upper lid and brow laceration that was repaired(at [facility]) Has kidney failure and on dialysis.  She has a small pleural effusion, has trouble breathing and had an Oxygen saturation of 70% at [place]. It is planned that she will have dialysis tomorrow morning and a possible thoracentesis for the pleural effusion.  External exam:  R brow laceration: 2cm already repaired                             R upper lid superficial abrasion                           R lateral canthal ecchymosis minimal edema VA:  OD: Light Perception         OS:  Near Card with correction 20/60 Pupils: OD: not visible             OS: 4mm round non-reactive EOMs:  Normal OU Applanation IOPs:  OD: 9                                OS: 18  Slit Lamp Exam: Lids: OD: superficial abrasion RUL temporally          OS:  Normal Conj: OD:  Small subconj heme temporally, 1+ injection           OS:  Normal Cornea:  OD:  Clear except for                 The cataract surgery wound appears to be slightly open inferiorly and there is a +Seidel test                  At the inferior edge of the wound                  OS:   Clear except for [facility]:  OD: Deep and formed, there is a layered 10% hyphema inferiorly with clear aqueous fluid                 superiorly with 2+ RBCs         OS: Deep and quiet  Iris:   OD: almost completely  obscured by a blood clot,the pupil is not visible, there is no iris visible entrapped in the cataract surgery wound          OS: Normal with possible posterior synechiae to IOL and/or posterior capsule with Elschnig Pearls at the pupillary margin Lens: OD: IOL by history but not visible through the blood clot on the iris surface            OS: PCIOL  Assess/Plan: I have discussed this thoroughly with the patient and her mother with the nurse in the  room as a witness that with a normal cataract surgery, the incision is so small that we normally do not put any sutures in the incision--and that appears to be the type of surgical incision that the patient had. I told her that normally the incision heals by itself with no leakage and that if there is leakage from the incision DURING cataract surgery we normally hydrate the wound with fluid and it seals or we place a suture in the wound to keep it from leaking. If we find that the wound is leaking AFTER cataract surgery the options are to go back to surgery and hydrate the incision or place a suture OR watch the wound to see if it seals by itself within a short period of time(a few days) But there is a risk of infection that goes with this that could lead to loss of vision or loss of the eye. But there is also a risk of going to surgery that could also lead to infection and loss of vision or loss of the eye.  I told the patient that normally in a case like this I would take her to surgery, rinse out some of the blood in the anterior chamber and put a suture in the incision. But the problem in her case is that she must lie flat for the surgical procedure and with her difficulty breathing she may not be a good candidate to go to surgery at this time. Anesthesia may advise against it and I would not want to put additional risk on her if she did not feel she could go to surgery at this time.  I wanted to let her also know that only she can make decisions about surgery --  I am only telling her the risks and benefits.At this time the patient has decided that she wishes me to observe the eye for now and not go to surgery.  I have started antibiotic eyedrops and antiinflammatory drops the same way we would following cataract surgery. Also will place a [facility] to be kept taped over her Right eye at all times except when her drops are being placed.   I have also told her that she can change her decision at any time particularly if she is doing better after dialysis or if I feel that her risks have changed and she is more likely to have an adverse outcome if surgery is not performed in a timely manner.  Trish Fountain MD  Dilated fundus exam:  Tropicamide 1% OU x 4 OD: No View OS: C/D ratio: 0.3  Macula: focal laser scars Vessels: Attenuated Periphery: Heavy PRP scars 360    Trish Fountain MD

## 2013-03-29 NOTE — Progress Notes (Signed)
Hemodialysis- total UF 5L today. BP remained stable throughout. Post HD bp 150s/70s. O2 was decreased from 6L [place] to 3L  sats 94-97%. Pt tolerated well without issue.

## 2013-03-29 NOTE — Progress Notes (Signed)
PROGRESS NOTE  Connie Christian:[id number] DOB: 10-04-1956 DOA: 03/28/2013 PCP: No primary provider on file.  Brief narrative: 57 yr old CF with ESRD t/Th/Sat admitted to SDU 4.18 with fall resulting in Intra-ocular hemorrhage  Noted to be hypoxic at OSH and sent to [facility] for sub-specialty Optho care.  Past medical history-As per Problem list Chart reviewed as below-   Consultants:  Optho-[facility]  Renal  Procedures:   nad  Antibiotics:  nad   Subjective  Feels fair.  Hungry.  Weighing options for surgery to eye still Not Sob right now but nursing reports desaturated to 80's when she fell asleep   Objective    Interim History: nad  Telemetry: NSR   Objective: Filed Vitals:   03/29/13 0412 03/29/13 0424 03/29/13 0500 03/29/13 0600  BP: 171/60  160/50 151/36  Pulse: 79 78 79 82  Temp: 98.6 F (37 C)     TempSrc: Oral     Resp: 20 20 20 20   Height:      Weight: 82 kg (180 lb 12.4 oz)     SpO2: 85% 92% 95% 94%   No intake or output data in the 24 hours ending 03/29/13 0757  Exam:  General: NAD  eyes: Fox shiled +  ENT: supple soft  Neck: soft non swollen  Cardiovascular: S1-S2 no murmur rub  Respiratory: Medically clear no added sound appreciable tactile vocal resonance and   Data Reviewed: Basic Metabolic Panel:  Recent Labs Lab 03/29/13 0430  NA 138  K 3.8  CL 97  CO2 21  GLUCOSE 205*  BUN 33*  CREATININE 6.67*  CALCIUM 9.1   Liver Function Tests:  Recent Labs Lab 03/29/13 0430  AST 21  ALT 22  ALKPHOS 171*  BILITOT 0.2*  PROT 7.0  ALBUMIN 3.1*   No results found for this basename: LIPASE, AMYLASE,  in the last 168 hours No results found for this basename: AMMONIA,  in the last 168 hours CBC:  Recent Labs Lab 03/29/13 0430  WBC 11.2*  HGB 11.1*  HCT 34.9*  MCV 98.6  PLT 161   Cardiac Enzymes: No results found for this basename: CKTOTAL, CKMB, CKMBINDEX, TROPONINI,  in the last 168 hours BNP: No components found  with this basename: POCBNP,  CBG: No results found for this basename: GLUCAP,  in the last 168 hours  Recent Results (from the past 240 hour(s))  MRSA PCR SCREENING     Status: None   Collection Time    03/28/13  6:51 PM      Result Value Range Status   MRSA by PCR NEGATIVE  NEGATIVE Final   Comment:            The GeneXpert MRSA Assay (FDA     approved for NASAL specimens     only), is one component of a     comprehensive MRSA colonization     surveillance program. It is not     intended to diagnose MRSA     infection nor to guide or     monitor treatment for     MRSA infections.     Studies:              All Imaging reviewed and is as per above notation   Scheduled Meds: . atropine  1 drop Right Eye Q8H  . gatifloxacin  1 drop Right Eye Q6H  . prednisoLONE acetate  1 drop Right Eye Q6H  . sodium chloride  3 mL Intravenous  Q12H  . tobramycin  1 drop Right Eye Q6H   Continuous Infusions:    Assessment/Plan: Active Problems:  * No active hospital problems. *   1. Intraocular hemorrhage-ophthalmologist Dr. Gwen Pounds has seen patient.  She defers surgery at this point.  Continue Atropine drops/gatifloxacin drops/ Pred forte/Tobramycin/Tropicamide 2. Hypoxia-unclear etiology. Has large pleural effusion based on CT scan. This seems to have some compressive atelectasis. CXR 4.19 does show atelectasis and mod effusions-will repeat CXr after dialysis.  IF this doesn't appear better, will need to get thoracocentesis 3. End-stage renal disease-Dialysis to be arranged later today.  4. Htn-Continue Felodipine 10 mg.  THis has been held today given needs for volume removal at dilaysis 5. Diabetes mellitus patient is on 7030 insulin.  Will check am sugars.  Started renal diet and patient not for any planned procedure currently 6. Metabolic bone disease continue calcium carbonate and Sensipar  Code Status: Full Family Communication: discussed with mother and patient  Disposition Plan:  SDU.  Likely transfer tele in a day vs d/c home   Pleas Koch, MD  [facility] Pager [phone] 03/29/2013, 7:57 AM    LOS: 1 day

## 2013-03-29 NOTE — Consult Note (Signed)
  Slit Lamp Exam: Lids: OD: RUL abrasion           OS: normal Conj: OD: subconj heme temp 1+ inj          OS: Normal Cornea:  OD: clear except for band keratopathy medial/lateral few Descemet's folds                OS: clear except for band keratopathy medial/lateral A/C: OD: deep and formed Hyphema has resolved  2+ RBCs in aqueous          OS: deep and Quiet Iris:   OD: not visible covered with blood clot          OS: Normal with possible posterior synechiae Lens: OD: not visible/ IOL           OS: PCIOL Applanation IOPs: OD: 16                               OS: 18 Negative Seidel OD!  Assess/Plan: IOP is up and the Seidel is negative so the cataract incision is closed again but very weak because it was open--hopefully it will continue to heal closed and will be back to normal soon. Patient is still to wear the [facility] to protect from any rubbing or pressure. She should continue the antibiotic drops and the Atropine and Pred Forte because of the trauma and inflammation that has caused and as the blood clot continues to resolve.  Fortunately the hyphema is resolve at this point but she must be watched closely for a re-bleed especially between 7-10 days after her original injury. She may be discharged tomorrow and prefers to be followed in [place]  because that is where she has her dialysis and appointments there would be much more convenient. Patient's mother has my number and I think she needs to be seen within the next few days--Monday or Tuesday.  Trish Fountain MD

## 2013-03-29 NOTE — Consult Note (Addendum)
Connie Christian 03/29/2013 Connie Christian D Requesting Physician:  Dr Mahala Menghini  Reason for Consult:  Need for dialysis HPI: The patient is a 57 y.o. year-old patient with hx of ESRD (vanc toxicity + DM/HTN) on HD x 7 years, HTN, DM x 18 yrs with retinopathy who fell at home and hit her face on the nightstand table. CT head suggested intraocular bleed. CT chest showed large R effusion and atx vs consolidation. CXR showed R effusion , persistent. Oxygen sat was only 70% at outside ED.  BP 150/80. Seen by optho MD.   Today pt is stable, sitting up no side of bed. Feels OK as long as "oxygen mask is in place".    ROS  no fever, prod cough or CP  no abd pain   no n.v.d  no confusion or hallucinations   Past Medical History:  Past Medical History  Diagnosis Date  . ESRD (end stage renal disease) on dialysis     started 12/2005  . Hypertension   . DM (diabetes mellitus)   . Anemia of chronic kidney failure   . Hyperparathyroidism due to renal insufficiency   . Closed right ankle fracture   . Ulcer of right foot   . 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 Orif ankle fracture      right  . I& d foot ulcer      right 2007  . Cataracts      bilateral  . Av fistula placement  08/31/06    left arm - removed after conversion to AVG failed  . Arteriovenous graft placement  09/05/06    removed 09/05/06 - nonmatured  . Hemodialysis catheters  06/16/10, 10/01/12, 11/05/12    patient refuses other access than catheters    Family History:  Family History  Problem Relation Age of Onset  . Stroke Mother     age 53   Social History:  reports that she has never smoked. She does not have any smokeless tobacco history on file. Her alcohol and drug histories are not on file.  Allergies:  Allergies  Allergen Reactions  . Amoxicillin Itching and Rash  . Ciprofloxacin Itching and Rash  . Daptomycin Itching and Rash  .  Iron Dextran     hypotension  . Penicillins Itching and Rash  . Vancomycin Itching, Rash and Other (See Comments)    Pt states that this medications caused her "skin to peel"    Home medications: Prior to Admission medications   Medication Sig Start Date End Date Taking? Authorizing Provider  calcium carbonate (TUMS - DOSED IN MG ELEMENTAL CALCIUM) 500 MG chewable tablet Chew 3 tablets by mouth 2 (two) times daily as needed (with snacks).   Yes Historical Provider, MD  cinacalcet (SENSIPAR) 30 MG tablet Take 30 mg by mouth daily.   Yes Historical Provider, MD  felodipine (PLENDIL) 10 MG 24 hr tablet Take 5 mg by mouth 2 (two) times daily.   Yes Historical Provider, MD  insulin NPH-regular (NOVOLIN 70/30) (70-30) 100 UNIT/ML injection Inject 20-22 Units into the skin 2 (two) times daily with a meal. Inject 22 units in the morning and 20 units in the evening   Yes Historical Provider, MD  lanthanum (FOSRENOL) 1000 MG chewable tablet Chew 3,000 mg by mouth 3 (three) times daily.   Yes Historical Provider, MD  Loperamide HCl (IMODIUM PO) Take 1 tablet by mouth every 4 (four)  hours as needed (diarrhea).   Yes Historical Provider, MD  Multiple Vitamin (MULTIVITAMIN WITH MINERALS) TABS Take 1 tablet by mouth daily.   Yes Historical Provider, MD  Skin Protectants, Misc. (EUCERIN) cream Apply 1 application topically as needed for dry skin.   Yes Historical Provider, MD    Labs: Basic Metabolic Panel:  Recent Labs Lab 03/29/13 0430  NA 138  K 3.8  CL 97  CO2 21  GLUCOSE 205*  BUN 33*  CREATININE 6.67*  CALCIUM 9.1   Liver Function Tests:  Recent Labs Lab 03/29/13 0430  AST 21  ALT 22  ALKPHOS 171*  BILITOT 0.2*  PROT 7.0  ALBUMIN 3.1*   No results found for this basename: LIPASE, AMYLASE,  in the last 168 hours No results found for this basename: AMMONIA,  in the last 168 hours CBC:  Recent Labs Lab 03/29/13 0430  WBC 11.2*  HGB 11.1*  HCT 34.9*  MCV 98.6  PLT 161    PT/INR: @LABRCNTIP (inr:5) Cardiac Enzymes: )No results found for this basename: CKTOTAL, CKMB, CKMBINDEX, TROPONINI,  in the last 168 hours CBG:  Recent Labs Lab 03/28/13 2236 03/29/13 0835 03/29/13 1106  GLUCAP 146* 243* 277*    Physical Exam:  Blood pressure 169/63, pulse 81, temperature 98.9 F (37.2 C), temperature source Oral, resp. rate 24, height 5\' 4"  (1.626 m), weight 82 kg (180 lb 12.4 oz), SpO2 95.00%.  Gen: alert, sitting up, face mask O2, no distress HEENT:  EOMI, sclera anicteric, throat clear Neck: no JVD, no LAN Chest: decreased R base 1/3 up, L clear CV: regular, no rub or gallop, pedal pulses intact Abdomen: soft, nontender Ext: trace LE pretibial edema bilat, R foot deformity (chronic), no gangrene or ulceration Neuro: alert, Ox3, no focal deficit Access: L IJ tunneled HD cath intact  CXR- large R pleural effusion, increased from last CXR Chest CT 4/18 - large R effusion, some consolidation at RLL also  Outpatient HD:  TTS at [facility] HD  3hr 45 min  EDW 78.5 kg   2K/2Ca bath   Profile 4  L IJ TDC   Hectorol 8ug   Heparin 5000 unit bolus   Venofer 100 mg each HD   Impression/Plan 1. S/P fall w intraocular hemorrhage- per primary / opthalmology 2. Hypoxemia- large pleural effusion +/- consolidation. Effusion is probably due to volume overload, needs aggressive UF with HD today 3. ESRD, cont HD TTS. Pt has been HD cath dependent for 7 years. Had attempt at L AVF and then L AVG, developed steal L hand after AVG and neither access was able to be used. AVG was removed , AVF ligated or clotted. No heparin HD for now due to #1 4. HTN/volume- volume overloaded with R effusion, max UF, lower EDW. On plendil only at home for BP, on hold for now, agree. May need thoracentesis if effusion doesn't improve with vol reduction 5. Anemia of CKD- Hb 11 not on EPO, IV iron load to finish on 4/22 6. Secondary HPTH- cont sensipar 30/d, Fosrenol 1500mg  ac tid and Tum's ultra   1ac tid, vit D with HD   Vinson Moselle  MD [facility] [phone] pgr    [phone] cell 03/29/2013, 12:25 PM

## 2013-03-30 ENCOUNTER — Inpatient Hospital Stay (HOSPITAL_COMMUNITY): Payer: Medicare Other

## 2013-03-30 LAB — GLUCOSE, CAPILLARY: Glucose-Capillary: 173 mg/dL — ABNORMAL HIGH (ref 70–99)

## 2013-03-30 MED ORDER — FELODIPINE ER 5 MG PO TB24
5.0000 mg | ORAL_TABLET | Freq: Two times a day (BID) | ORAL | Status: DC
  Administered 2013-03-30 – 2013-04-06 (×14): 5 mg via ORAL
  Filled 2013-03-30 (×16): qty 1

## 2013-03-30 NOTE — Progress Notes (Signed)
PROGRESS NOTE  Connie Christian ZOX:[id number] DOB: 1956-05-16 DOA: 03/28/2013 PCP: No primary provider on file.  Brief narrative: 57 yr old CF with ESRD t/Th/Sat admitted to SDU 4.18 with fall resulting in Intra-ocular hemorrhage  Noted to be hypoxic at OSH and sent to [facility] for sub-specialty Optho care.  Past medical history-As per Problem list Chart reviewed as below-  Consultants:  Optho-Kowalski  Renal  Procedures:   nad  Antibiotics:  nad   Subjective  Wants to go home.  denies fever chills, but has had longstanding cough. Mother in room   Objective    Interim History: nad  Telemetry: NSR   Objective: Filed Vitals:   03/30/13 0500 03/30/13 0524 03/30/13 0600 03/30/13 0700  BP: 176/74 176/74 166/55 158/54  Pulse: 86  79 80  Temp:      TempSrc:      Resp: 17  23 19   Height:      Weight:      SpO2:    95%    Intake/Output Summary (Last 24 hours) at 03/30/13 0757 Last data filed at 03/29/13 1800  Gross per 24 hour  Intake    816 ml  Output   4911 ml  Net  -4095 ml    Exam:  General: NAD  eyes: Fox shield +  ENT: supple soft  Neck: soft non swollen  Cardiovascular: S1-S2 no murmur rub  Respiratory: Medically clear no added sound appreciable slightly more attenuated lung sounds R> L post bases   Data Reviewed: Basic Metabolic Panel:  Recent Labs Lab 03/29/13 0430  NA 138  K 3.8  CL 97  CO2 21  GLUCOSE 205*  BUN 33*  CREATININE 6.67*  CALCIUM 9.1   Liver Function Tests:  Recent Labs Lab 03/29/13 0430  AST 21  ALT 22  ALKPHOS 171*  BILITOT 0.2*  PROT 7.0  ALBUMIN 3.1*   No results found for this basename: LIPASE, AMYLASE,  in the last 168 hours No results found for this basename: AMMONIA,  in the last 168 hours CBC:  Recent Labs Lab 03/29/13 0430  WBC 11.2*  HGB 11.1*  HCT 34.9*  MCV 98.6  PLT 161   Cardiac Enzymes: No results found for this basename: CKTOTAL, CKMB, CKMBINDEX, TROPONINI,  in the last 168  hours BNP: No components found with this basename: POCBNP,  CBG:  Recent Labs Lab 03/28/13 2236 03/29/13 0835 03/29/13 1106 03/29/13 1816  GLUCAP 146* 243* 277* 137*    Recent Results (from the past 240 hour(s))  MRSA PCR SCREENING     Status: None   Collection Time    03/28/13  6:51 PM      Result Value Range Status   MRSA by PCR NEGATIVE  NEGATIVE Final   Comment:            The GeneXpert MRSA Assay (FDA     approved for NASAL specimens     only), is one component of a     comprehensive MRSA colonization     surveillance program. It is not     intended to diagnose MRSA     infection nor to guide or     monitor treatment for     MRSA infections.     Studies:              All Imaging reviewed and is as per above notation   Scheduled Meds: . atropine  1 drop Right Eye Q8H  . calcium carbonate  400  mg of elemental calcium Oral TID WC  . cinacalcet  30 mg Oral Q breakfast  . [START ON 04/01/2013] doxercalciferol  8 mcg Intravenous Q T,Th,Sa-HD  . ferric gluconate (FERRLECIT/NULECIT) IV  125 mg Intravenous Q T,Th,Sa-HD  . gatifloxacin  1 drop Right Eye Q6H  . insulin aspart protamine-insulin aspart  20 Units Subcutaneous BID WC  . lanthanum  1,500 mg Oral TID WC  . multivitamin with minerals  1 tablet Oral Daily  . prednisoLONE acetate  1 drop Right Eye Q6H  . sodium chloride  3 mL Intravenous Q12H  . tobramycin  1 drop Right Eye Q6H   Continuous Infusions:    Assessment/Plan: Active Problems:  * No active hospital problems. *   1. Intraocular hemorrhage-ophthalmologist Dr. Gwen Pounds has seen patient.  She defers surgery at this point.  Continue Atropine drops/gatifloxacin drops/ Pred forte/Tobramycin/Tropicamide 2. Hypoxia-unclear etiology. Has large pleural effusion based on CT scan. CXR 4.19 does show atelectasis and mod effusions-Rpt CXR shows persistence/worsening of area of effusion despite dialysis--Currently not really inclined to start antibiotics as not  febrile no white count etc..  discussed with pulmonary Dr. Bard Herbert options-he will see patient and determine best options.  Appreciate inout 3. End-stage renal disease-T/Th/Sat-Appreciate Nephro input.  -5 liters 4.19 4. Htn-Continue Felodipine 10 mg.   5. Diabetes mellitus patient is on 7030 insulin.  Continue to monitor 6. Metabolic bone disease continue calcium carbonate and Sensipar  Code Status: Full Family Communication: discussed with mother and patient  Disposition Plan: transfer to tele after seen by Pulmonary   Pleas Koch, MD  [facility] Pager [phone] 03/30/2013, 7:57 AM    LOS: 2 days

## 2013-03-30 NOTE — Progress Notes (Signed)
Subjective: 5kg off with HD , looks better today  Objective Vital signs in last 24 hours: Filed Vitals:   03/30/13 0700 03/30/13 0750 03/30/13 0801 03/30/13 0913  BP: 158/54  165/59 162/55  Pulse: 80 81 83   Temp:   99.4 F (37.4 C)   TempSrc:   Oral   Resp: 19 16 18    Height:      Weight:      SpO2: 95% 89% 94%    Weight change: -2.7 kg (-5 lb 15.2 oz)  Intake/Output Summary (Last 24 hours) at 03/30/13 1011 Last data filed at 03/30/13 0829  Gross per 24 hour  Intake    820 ml  Output   4911 ml  Net  -4091 ml   Labs: Basic Metabolic Panel:  Recent Labs Lab 03/29/13 0430  NA 138  K 3.8  CL 97  CO2 21  GLUCOSE 205*  BUN 33*  CREATININE 6.67*  CALCIUM 9.1   Liver Function Tests:  Recent Labs Lab 03/29/13 0430  AST 21  ALT 22  ALKPHOS 171*  BILITOT 0.2*  PROT 7.0  ALBUMIN 3.1*   No results found for this basename: LIPASE, AMYLASE,  in the last 168 hours No results found for this basename: AMMONIA,  in the last 168 hours CBC:  Recent Labs Lab 03/29/13 0430  WBC 11.2*  HGB 11.1*  HCT 34.9*  MCV 98.6  PLT 161   PT/INR: @LABRCNTIP (inr:5)   Scheduled Meds ) . atropine  1 drop Right Eye Q8H  . calcium carbonate  400 mg of elemental calcium Oral TID WC  . cinacalcet  30 mg Oral Q breakfast  . [START ON 04/01/2013] doxercalciferol  8 mcg Intravenous Q T,Th,Sa-HD  . felodipine  5 mg Oral BID  . ferric gluconate (FERRLECIT/NULECIT) IV  125 mg Intravenous Q T,Th,Sa-HD  . gatifloxacin  1 drop Right Eye Q6H  . insulin aspart protamine-insulin aspart  20 Units Subcutaneous BID WC  . lanthanum  1,500 mg Oral TID WC  . multivitamin with minerals  1 tablet Oral Daily  . prednisoLONE acetate  1 drop Right Eye Q6H  . sodium chloride  3 mL Intravenous Q12H  . tobramycin  1 drop Right Eye Q6H    Physical Exam:  Blood pressure 162/55, pulse 83, temperature 99.4 F (37.4 C), temperature source Oral, resp. rate 18, height 5\' 4"  (1.626 m), weight 75.5 kg (166  lb 7.2 oz), SpO2 94.00%.  Gen: less SOB HEENT: EOMI, sclera anicteric, throat clear  Neck: no JVD, no LAN  Chest: decreased R base 1/3 up, L clear  CV: regular, no rub or gallop, pedal pulses intact  Abdomen: soft, nontender  Ext: trace LE pretibial edema bilat, R foot deformity (chronic), no gangrene or ulceration  Neuro: alert, Ox3, no focal deficit  Access: L IJ tunneled HD cath intact   CXR- large R pleural effusion, increased from last CXR  Chest CT 4/18 - large R effusion, some consolidation at RLL also   Outpatient HD: TTS at [facility] HD 3hr 45 min EDW 78.5 kg 2K/2Ca bath Profile 4 L IJ TDC Hectorol 8ug Heparin 5000 unit bolus Venofer 100 mg each HD   Impression/Plan  1. S/P fall w intraocular hemorrhage- per primary / opthalmology 2. Hypoxemia due to volume overload / large L pleural effusion +/- consolidation- tolerating UF, will need lower dry weight, will do extra HD Monday 3. ESRD, cont hd TTS via [facility]- has been cath dependent x 7 years. No heparin  HD for now due to #1 4. HTN/volume- plendil is only BP med at home, on hold for now. See above 5. Anemia of CKD- Hb 11 not on EPO, IV iron load to finish on 4/22 6. Secondary HPTH- cont sensipar 30/d, Fosrenol 1500mg  ac tid and Tum's ultra 1ac tid, vit D with HD    Vinson Moselle  MD [phone] pgr    [phone] cell 03/30/2013, 10:11 AM

## 2013-03-30 NOTE — Progress Notes (Signed)
Had to place pt on NRB (previously on venturi mask 50%) while sleeping for sats in the mid 80s.  Pt currently 95% on NRB.  Made Dr. Mahala Menghini aware.  Per MD, will keep pt on step-down another day.  Will continue to monitor.  Salomon Mast, RN

## 2013-03-31 LAB — RENAL FUNCTION PANEL
Albumin: 2.7 g/dL — ABNORMAL LOW (ref 3.5–5.2)
BUN: 45 mg/dL — ABNORMAL HIGH (ref 6–23)
Chloride: 93 mEq/L — ABNORMAL LOW (ref 96–112)
GFR calc Af Amer: 7 mL/min — ABNORMAL LOW (ref >90)
Glucose, Bld: 192 mg/dL — ABNORMAL HIGH (ref 70–99)
Potassium: 4.2 mEq/L (ref 3.5–5.1)
Sodium: 132 mEq/L — ABNORMAL LOW (ref 135–145)

## 2013-03-31 LAB — CBC
HCT: 35.5 % — ABNORMAL LOW (ref 36.0–46.0)
Hemoglobin: 11.5 g/dL — ABNORMAL LOW (ref 12.0–15.0)
RDW: 15.1 % (ref 11.5–15.5)
WBC: 11 10*3/uL — ABNORMAL HIGH (ref 4.0–10.5)

## 2013-03-31 LAB — GLUCOSE, CAPILLARY
Glucose-Capillary: 274 mg/dL — ABNORMAL HIGH (ref 70–99)
Glucose-Capillary: 207 mg/dL — ABNORMAL HIGH (ref 70–99)
Glucose-Capillary: 222 mg/dL — ABNORMAL HIGH (ref 70–99)
Glucose-Capillary: 327 mg/dL — ABNORMAL HIGH (ref 70–99)

## 2013-03-31 MED ORDER — SODIUM CHLORIDE 0.9 % IV SOLN: 100.0000 mL | INTRAVENOUS | Status: DC | PRN

## 2013-03-31 MED ORDER — NEPRO/CARBSTEADY PO LIQD: 237.0000 mL | ORAL | Status: DC | PRN

## 2013-03-31 MED ORDER — HEPARIN SODIUM (PORCINE) 1000 UNIT/ML DIALYSIS: 1000.0000 [IU] | INTRAMUSCULAR | Status: DC | PRN

## 2013-03-31 MED ORDER — ALTEPLASE 2 MG IJ SOLR
2.0000 mg | Freq: Once | INTRAMUSCULAR | Status: DC | PRN
  Filled 2013-03-31: qty 2

## 2013-03-31 MED ORDER — LIDOCAINE-PRILOCAINE 2.5-2.5 % EX CREA: 1.0000 "application " | TOPICAL_CREAM | CUTANEOUS | Status: DC | PRN

## 2013-03-31 MED ORDER — LIDOCAINE HCL (PF) 1 % IJ SOLN: 5.0000 mL | INTRAMUSCULAR | Status: DC | PRN

## 2013-03-31 MED ORDER — PENTAFLUOROPROP-TETRAFLUOROETH EX AERO: 1.0000 "application " | INHALATION_SPRAY | CUTANEOUS | Status: DC | PRN

## 2013-03-31 MED ORDER — INSULIN ASPART 100 UNIT/ML ~~LOC~~ SOLN
3.0000 [IU] | Freq: Once | SUBCUTANEOUS | Status: AC
  Administered 2013-03-31: 3 [IU] via SUBCUTANEOUS

## 2013-03-31 NOTE — Progress Notes (Signed)
Subjective: Hypoxemic last night, feels ok today. Says only "drops when I'm asleep!".  No cp, sitting up side of bed  Objective Vital signs in last 24 hours: Filed Vitals:   03/31/13 0745 03/31/13 0921 03/31/13 1000 03/31/13 1100  BP: 151/58  154/50 162/61  Pulse: 88  81 80  Temp: 98.6 F (37 C)     TempSrc: Oral     Resp: 20  20 18   Height:      Weight:  77.4 kg (170 lb 10.2 oz)    SpO2: 93%  96% 93%   Weight change: 0.9 kg (1 lb 15.7 oz)  Intake/Output Summary (Last 24 hours) at 03/31/13 1138 Last data filed at 03/31/13 1000  Gross per 24 hour  Intake    720 ml  Output      0 ml  Net    720 ml   Labs: Basic Metabolic Panel:  Recent Labs Lab 03/29/13 0430  NA 138  K 3.8  CL 97  CO2 21  GLUCOSE 205*  BUN 33*  CREATININE 6.67*  CALCIUM 9.1   Liver Function Tests:  Recent Labs Lab 03/29/13 0430  AST 21  ALT 22  ALKPHOS 171*  BILITOT 0.2*  PROT 7.0  ALBUMIN 3.1*   No results found for this basename: LIPASE, AMYLASE,  in the last 168 hours No results found for this basename: AMMONIA,  in the last 168 hours CBC:  Recent Labs Lab 03/29/13 0430  WBC 11.2*  HGB 11.1*  HCT 34.9*  MCV 98.6  PLT 161   PT/INR: @LABRCNTIP (inr:5)   Scheduled Meds ) . atropine  1 drop Right Eye Q8H  . calcium carbonate  400 mg of elemental calcium Oral TID WC  . cinacalcet  30 mg Oral Q breakfast  . [START ON 04/01/2013] doxercalciferol  8 mcg Intravenous Q T,Th,Sa-HD  . felodipine  5 mg Oral BID  . ferric gluconate (FERRLECIT/NULECIT) IV  125 mg Intravenous Q T,Th,Sa-HD  . gatifloxacin  1 drop Right Eye Q6H  . insulin aspart protamine-insulin aspart  20 Units Subcutaneous BID WC  . lanthanum  1,500 mg Oral TID WC  . multivitamin with minerals  1 tablet Oral Daily  . prednisoLONE acetate  1 drop Right Eye Q6H  . sodium chloride  3 mL Intravenous Q12H  . tobramycin  1 drop Right Eye Q6H    Physical Exam:  Blood pressure 162/61, pulse 80, temperature 98.6 F (37  C), temperature source Oral, resp. rate 18, height 5\' 4"  (1.626 m), weight 77.4 kg (170 lb 10.2 oz), SpO2 93.00%.  Gen: less SOB HEENT: EOMI, sclera anicteric, throat clear  Neck: no JVD, no LAN  Chest: bibasilar crackles CV: regular, no rub or gallop, pedal pulses intact  Abdomen: soft, nontender  Ext: trace LE pretibial edema bilat, R foot deformity (chronic), no gangrene or ulceration  Neuro: alert, Ox3, no focal deficit  Access: L IJ tunneled HD cath intact   Outpatient HD: TTS at [facility] HD 3hr 45 min EDW 78.5 kg 2K/2Ca bath Profile 4 L IJ TDC Hectorol 8ug Heparin 5000 unit bolus Venofer 100 mg each HD   Impression/Plan  1. S/P fall w intraocular hemorrhage- per primary / opthalmology 2. Hypoxemia due to volume overload / large L pleural effusion +/- consolidation- tolerating UF, will need lower dry weight, HD today. Discussed with primary MD, will get f/u cxr after HD today 3. ESRD, cont hd TTS via [facility]- has been cath dependent x 7 years. No heparin  HD for now due to #1 4. HTN/volume- plendil is only BP med at home, on hold for now. See above 5. Anemia of CKD- Hb 11 not on EPO, IV iron load to finish on 4/22 6. Secondary HPTH- cont sensipar 30/d, Fosrenol 1500mg  ac tid and Tum's ultra 1ac tid, vit D with HD    Vinson Moselle  MD [phone] pgr    [phone] cell 03/31/2013, 11:38 AM

## 2013-03-31 NOTE — Progress Notes (Signed)
PROGRESS NOTE  Connie Christian WUJ:[id number] DOB: [birth year] DOA: 03/28/2013 PCP: No primary provider on file.  Brief narrative: 57 yr old CF with ESRD t/Th/Sat admitted to SDU 4.18 with fall resulting in Intra-ocular hemorrhage.  Noted to be hypoxic at OSH and sent to [facility] for sub-specialty Optho care.  Past medical history-As per Problem list Chart reviewed as below-  Consultants:  Optho-Kowalski  Renal  Procedures:   nad  Antibiotics:  nad   Subjective  doing fair.  Seen on HD unit.  Desat's somewhat off of oxygen to low 90's   Objective    Interim History: nad  Telemetry: NSR   Objective: Filed Vitals:   03/31/13 1300 03/31/13 1330 03/31/13 1400 03/31/13 1430  BP: 153/69 117/56 106/57 111/60  Pulse: 80 79 77 81  Temp:      TempSrc:      Resp: 20 18 17 21   Height:      Weight:      SpO2:   96% 94%    Intake/Output Summary (Last 24 hours) at 03/31/13 1507 Last data filed at 03/31/13 1000  Gross per 24 hour  Intake    480 ml  Output      0 ml  Net    480 ml    Exam:  General: NAD  eyes: Fox shield +  ENT: supple soft  Neck: soft non swollen  Cardiovascular: S1-S2 no murmur rub  Respiratory: Medically clear no added sound appreciable    Data Reviewed: Basic Metabolic Panel:  Recent Labs Lab 03/29/13 0430 03/31/13 1258  NA 138 132*  K 3.8 4.2  CL 97 93*  CO2 21 28  GLUCOSE 205* 192*  BUN 33* 45*  CREATININE 6.67* 7.17*  CALCIUM 9.1 10.9*  PHOS  --  4.8*   Liver Function Tests:  Recent Labs Lab 03/29/13 0430 03/31/13 1258  AST 21  --   ALT 22  --   ALKPHOS 171*  --   BILITOT 0.2*  --   PROT 7.0  --   ALBUMIN 3.1* 2.7*   No results found for this basename: LIPASE, AMYLASE,  in the last 168 hours No results found for this basename: AMMONIA,  in the last 168 hours CBC:  Recent Labs Lab 03/29/13 0430 03/31/13 1258  WBC 11.2* 11.0*  HGB 11.1* 11.5*  HCT 34.9* 35.5*  MCV 98.6 97.5  PLT 161 177   Cardiac Enzymes: No  results found for this basename: CKTOTAL, CKMB, CKMBINDEX, TROPONINI,  in the last 168 hours BNP: No components found with this basename: POCBNP,  CBG:  Recent Labs Lab 03/29/13 1816 03/30/13 0803 03/30/13 1724 03/31/13 0744 03/31/13 1142  GLUCAP 137* 173* 274* 287* 207*    Recent Results (from the past 240 hour(s))  MRSA PCR SCREENING     Status: None   Collection Time    03/28/13  6:51 PM      Result Value Range Status   MRSA by PCR NEGATIVE  NEGATIVE Final   Comment:            The GeneXpert MRSA Assay (FDA     approved for NASAL specimens     only), is one component of a     comprehensive MRSA colonization     surveillance program. It is not     intended to diagnose MRSA     infection nor to guide or     monitor treatment for     MRSA infections.  Studies:              All Imaging reviewed and is as per above notation   Scheduled Meds: . atropine  1 drop Right Eye Q8H  . calcium carbonate  400 mg of elemental calcium Oral TID WC  . cinacalcet  30 mg Oral Q breakfast  . [START ON 04/01/2013] doxercalciferol  8 mcg Intravenous Q T,Th,Sa-HD  . felodipine  5 mg Oral BID  . ferric gluconate (FERRLECIT/NULECIT) IV  125 mg Intravenous Q T,Th,Sa-HD  . gatifloxacin  1 drop Right Eye Q6H  . insulin aspart protamine-insulin aspart  20 Units Subcutaneous BID WC  . lanthanum  1,500 mg Oral TID WC  . multivitamin with minerals  1 tablet Oral Daily  . prednisoLONE acetate  1 drop Right Eye Q6H  . sodium chloride  3 mL Intravenous Q12H  . tobramycin  1 drop Right Eye Q6H   Continuous Infusions:    Assessment/Plan: Active Problems:  * No active hospital problems. *   1. Intraocular hemorrhage-ophthalmologist Dr. Gwen Pounds has seen patient.  She defers surgery at this point.  Continue Atropine drops/gatifloxacin drops/ Pred forte/Tobramycin/Tropicamide 2. Hypoxia-unclear etiology. Has large pleural effusion based on CT scan. CXR 4.19 does show atelectasis and mod  effusions-Rpt CXR shows persistence/worsening of area of effusion despite dialysis--Currently not really inclined to start antibiotics as not febrile no white count etc..  discussed with pulmonary Dr. Bard Herbert -defer other management for now.  Rpt cxr 4.22. If persistent, ? Needs thoracocentesis 3. End-stage renal disease-T/Th/Sat-Appreciate Nephro input.  -5 liters 4.19 4. Htn-Continue Felodipine 10 mg.   5. Diabetes mellitus patient is on 7030 insulin.  Continue to monitor-[id number] 6. Metabolic bone disease continue calcium carbonate and Sensipar  Code Status: Full Family Communication: discussed with  patient  Disposition Plan: transfer to tele    Pleas Koch, MD  [facility] Pager [phone] 03/31/2013, 3:07 PM    LOS: 3 days

## 2013-04-01 ENCOUNTER — Other Ambulatory Visit (HOSPITAL_COMMUNITY): Payer: Self-pay

## 2013-04-01 ENCOUNTER — Inpatient Hospital Stay (HOSPITAL_COMMUNITY): Payer: Medicare Other

## 2013-04-01 DIAGNOSIS — R0902 Hypoxemia: Secondary | ICD-10-CM

## 2013-04-01 DIAGNOSIS — J90 Pleural effusion, not elsewhere classified: Secondary | ICD-10-CM | POA: Diagnosis present

## 2013-04-01 DIAGNOSIS — H44819 Hemophthalmos, unspecified eye: Secondary | ICD-10-CM | POA: Diagnosis present

## 2013-04-01 LAB — GLUCOSE, CAPILLARY: Glucose-Capillary: 223 mg/dL — ABNORMAL HIGH (ref 70–99)

## 2013-04-01 LAB — BLOOD GAS, ARTERIAL
Acid-base deficit: 0.1 mmol/L (ref 0.0–2.0)
Bicarbonate: 25.8 mEq/L — ABNORMAL HIGH (ref 20.0–24.0)
O2 Saturation: 61.6 %
Patient temperature: 98.6
pO2, Arterial: 34.7 mmHg — CL (ref 80.0–100.0)

## 2013-04-01 MED ORDER — LIDOCAINE-PRILOCAINE 2.5-2.5 % EX CREA: 1.0000 "application " | TOPICAL_CREAM | CUTANEOUS | Status: DC | PRN

## 2013-04-01 MED ORDER — HEPARIN SODIUM (PORCINE) 1000 UNIT/ML DIALYSIS: 3000.0000 [IU] | Freq: Once | INTRAMUSCULAR | Status: DC

## 2013-04-01 MED ORDER — ALTEPLASE 2 MG IJ SOLR
2.0000 mg | Freq: Once | INTRAMUSCULAR | Status: DC | PRN
  Filled 2013-04-01: qty 2

## 2013-04-01 MED ORDER — SODIUM CHLORIDE 0.9 % IV SOLN: 100.0000 mL | INTRAVENOUS | Status: DC | PRN

## 2013-04-01 MED ORDER — LIDOCAINE HCL (PF) 1 % IJ SOLN: 5.0000 mL | INTRAMUSCULAR | Status: DC | PRN

## 2013-04-01 MED ORDER — PENTAFLUOROPROP-TETRAFLUOROETH EX AERO: 1.0000 "application " | INHALATION_SPRAY | CUTANEOUS | Status: DC | PRN

## 2013-04-01 MED ORDER — INSULIN ASPART PROT & ASPART (70-30 MIX) 100 UNIT/ML ~~LOC~~ SUSP
24.0000 [IU] | Freq: Two times a day (BID) | SUBCUTANEOUS | Status: DC
  Administered 2013-04-01 – 2013-04-02 (×2): 24 [IU] via SUBCUTANEOUS

## 2013-04-01 MED ORDER — HEPARIN SODIUM (PORCINE) 1000 UNIT/ML DIALYSIS: 1000.0000 [IU] | INTRAMUSCULAR | Status: DC | PRN

## 2013-04-01 MED ORDER — GATIFLOXACIN 0.5 % OP SOLN: 1.0000 [drp] | Freq: Four times a day (QID) | OPHTHALMIC | Status: DC

## 2013-04-01 MED ORDER — TOBRAMYCIN 0.3 % OP SOLN: 1.0000 [drp] | Freq: Four times a day (QID) | OPHTHALMIC | Status: DC

## 2013-04-01 MED ORDER — ATROPINE SULFATE 1 % OP SOLN: 1.0000 [drp] | Freq: Three times a day (TID) | OPHTHALMIC | Status: AC

## 2013-04-01 MED ORDER — NEPRO/CARBSTEADY PO LIQD
237.0000 mL | ORAL | Status: DC | PRN
  Filled 2013-04-01: qty 237

## 2013-04-01 MED ORDER — DOXERCALCIFEROL 4 MCG/2ML IV SOLN
INTRAVENOUS | Status: AC
  Administered 2013-04-01: 8 ug via INTRAVENOUS
  Filled 2013-04-01: qty 4

## 2013-04-01 MED ORDER — PREDNISOLONE ACETATE 1 % OP SUSP: 1.0000 [drp] | Freq: Four times a day (QID) | OPHTHALMIC | Status: DC

## 2013-04-01 NOTE — Progress Notes (Signed)
ABG results are as follows with the pt on 2L [place]: Ph 7.28, pCo2 56.6, PaO2 34.7, Bicarb 25.8, and Sat of 61%. ABG results reported to MD and the pt was left on 2L [place] per MD order. No changes made to the patient's current treatment. RT will monitor.

## 2013-04-01 NOTE — Progress Notes (Signed)
Subjective: Hypoxemic on blood gas and cxr yest with persistent effusion. Also CO2 retention  Objective Vital signs in last 24 hours: Filed Vitals:   04/01/13 0500 04/01/13 0600 04/01/13 0700 04/01/13 0725  BP: 171/69 175/73 162/66 170/61  Pulse: 79 79 78 84  Temp: 97.9 F (36.6 C)   98.6 F (37 C)  TempSrc: Oral   Oral  Resp: 14 19 16 22   Height:      Weight: 74.3 kg (163 lb 12.8 oz)     SpO2: 96% 93% 90% 96%   Weight change: -2.8 kg (-6 lb 2.8 oz)  Intake/Output Summary (Last 24 hours) at 04/01/13 1103 Last data filed at 04/01/13 0700  Gross per 24 hour  Intake    480 ml  Output   2747 ml  Net  -2267 ml   Labs: Basic Metabolic Panel:  Recent Labs Lab 03/29/13 0430 03/31/13 1258  NA 138 132*  K 3.8 4.2  CL 97 93*  CO2 21 28  GLUCOSE 205* 192*  BUN 33* 45*  CREATININE 6.67* 7.17*  CALCIUM 9.1 10.9*  PHOS  --  4.8*   Liver Function Tests:  Recent Labs Lab 03/29/13 0430 03/31/13 1258  AST 21  --   ALT 22  --   ALKPHOS 171*  --   BILITOT 0.2*  --   PROT 7.0  --   ALBUMIN 3.1* 2.7*   No results found for this basename: LIPASE, AMYLASE,  in the last 168 hours No results found for this basename: AMMONIA,  in the last 168 hours CBC:  Recent Labs Lab 03/29/13 0430 03/31/13 1258  WBC 11.2* 11.0*  HGB 11.1* 11.5*  HCT 34.9* 35.5*  MCV 98.6 97.5  PLT 161 177   PT/INR: @LABRCNTIP (inr:5)   Scheduled Meds ) . atropine  1 drop Right Eye Q8H  . calcium carbonate  400 mg of elemental calcium Oral TID WC  . cinacalcet  30 mg Oral Q breakfast  . doxercalciferol  8 mcg Intravenous Q T,Th,Sa-HD  . felodipine  5 mg Oral BID  . ferric gluconate (FERRLECIT/NULECIT) IV  125 mg Intravenous Q T,Th,Sa-HD  . gatifloxacin  1 drop Right Eye Q6H  . insulin aspart protamine- aspart  20 Units Subcutaneous BID WC  . lanthanum  1,500 mg Oral TID WC  . multivitamin with minerals  1 tablet Oral Daily  . prednisoLONE acetate  1 drop Right Eye Q6H  . sodium chloride  3  mL Intravenous Q12H  . tobramycin  1 drop Right Eye Q6H    Physical Exam:  Blood pressure 170/61, pulse 84, temperature 98.6 F (37 C), temperature source Oral, resp. rate 22, height 5\' 4"  (1.626 m), weight 74.3 kg (163 lb 12.8 oz), SpO2 96.00%.  Gen: less SOB HEENT: EOMI, sclera anicteric, throat clear  Neck: no JVD, no LAN  Chest: bibasilar crackles CV: regular, no rub or gallop, pedal pulses intact  Abdomen: soft, nontender  Ext: trace LE pretibial edema bilat, R foot deformity (chronic), no gangrene or ulceration  Neuro: alert, Ox3, no focal deficit  Access: L IJ tunneled HD cath intact   Outpatient HD: TTS at [facility] HD 3hr 45 min EDW 78.5 kg 2K/2Ca bath Profile 4 L IJ TDC Hectorol 8ug Heparin 5000 unit bolus Venofer 100 mg each HD   Impression/Plan   1. Hypoxemia / R effusion / vol overload- weight is down 8kg from admission and pt is 4 kg below prior dry weight but CXR is still abnormal and  hypoxemia really is no better, possibly worse. Would look for other causes of hypoxemia. Discussed with Dr Mahala Menghini.  Plan HD again today with UF as tolerated to keep on schedule. 2. S/P fall with facial trauma/ocular hemorrhage 3. ESRD, cont hd TTS via [facility]- has been cath dependent x 7 years. No heparin HD for now due to #2 4. HTN/volume- plendil is only BP med at home, on hold for now 5. Anemia of CKD- Hb 11 not on EPO, IV iron load to finish on 4/22 6. Secondary HPTH- cont sensipar 30/d, Fosrenol 1500mg  ac tid and Tum's ultra 1ac tid, vit D with HD    Vinson Moselle  MD [phone] pgr    [phone] cell 04/01/2013, 11:03 AM

## 2013-04-01 NOTE — Progress Notes (Signed)
Inpatient Diabetes Program Recommendations  AACE/ADA: New Consensus Statement on Inpatient Glycemic Control (2013)  Target Ranges:  Prepandial:   less than 140 mg/dL      Peak postprandial:   less than 180 mg/dL (1-2 hours)      Critically ill patients:  140 - 180 mg/dL  Results for Connie Christian, Connie Christian (MRN [id number]) as of 04/01/2013 12:27  Ref. Range 03/31/2013 11:42 03/31/2013 16:46 03/31/2013 21:50 04/01/2013 08:42 04/01/2013 11:32  Glucose-Capillary Latest Range: 70-99 mg/dL 578 (H) 469 (H) 629 (H) 248 (H) 223 (H)   Inpatient Diabetes Program Recommendations Correction (SSI): add Novolog sensitive scale TID Thank you  Piedad Climes BSN, RN,CDE Inpatient Diabetes Coordinator [phone] (team pager)
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
## 2013-04-01 NOTE — Progress Notes (Signed)
Utilization Review Completed. 04/01/2013  

## 2013-04-01 NOTE — Procedures (Signed)
I was present at this dialysis session. I have reviewed the session itself and made appropriate changes.   Vinson Moselle, MD [employer] 04/01/2013, 1:31 PM
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
## 2013-04-02 ENCOUNTER — Inpatient Hospital Stay (HOSPITAL_COMMUNITY): Payer: Medicare Other

## 2013-04-02 ENCOUNTER — Encounter (HOSPITAL_COMMUNITY): Payer: Medicare Other

## 2013-04-02 DIAGNOSIS — E119 Type 2 diabetes mellitus without complications: Secondary | ICD-10-CM

## 2013-04-02 DIAGNOSIS — N186 End stage renal disease: Secondary | ICD-10-CM

## 2013-04-02 DIAGNOSIS — I517 Cardiomegaly: Secondary | ICD-10-CM

## 2013-04-02 LAB — PROTEIN, TOTAL: Total Protein: 7.6 g/dL (ref 6.0–8.3)

## 2013-04-02 LAB — BODY FLUID CELL COUNT WITH DIFFERENTIAL
Lymphs, Fluid: 84 %
Total Nucleated Cell Count, Fluid: 755 cu mm (ref 0–1000)

## 2013-04-02 LAB — GLUCOSE, CAPILLARY
Glucose-Capillary: 112 mg/dL — ABNORMAL HIGH (ref 70–99)
Glucose-Capillary: 104 mg/dL — ABNORMAL HIGH (ref 70–99)
Glucose-Capillary: 212 mg/dL — ABNORMAL HIGH (ref 70–99)

## 2013-04-02 LAB — PROTEIN, BODY FLUID: Total protein, fluid: 3.7 g/dL

## 2013-04-02 LAB — LACTATE DEHYDROGENASE: LDH: 199 U/L (ref 94–250)

## 2013-04-02 MED ORDER — ONDANSETRON HCL 4 MG/2ML IJ SOLN
4.0000 mg | Freq: Four times a day (QID) | INTRAMUSCULAR | Status: DC | PRN
  Filled 2013-04-02: qty 2

## 2013-04-02 MED ORDER — INSULIN ASPART PROT & ASPART (70-30 MIX) 100 UNIT/ML ~~LOC~~ SUSP
12.0000 [IU] | Freq: Two times a day (BID) | SUBCUTANEOUS | Status: DC
  Administered 2013-04-02 – 2013-04-04 (×3): 12 [IU] via SUBCUTANEOUS

## 2013-04-02 NOTE — Progress Notes (Addendum)
Iv team here on unit to restart iv due  to outdated site but pt is eating and will come back later.

## 2013-04-02 NOTE — Progress Notes (Signed)
[facility] PROGRESS NOTE  Connie Christian ZOX:[id number] DOB: [birth year] DOA: 03/28/2013 PCP: No primary provider on file.  Brief narrative: 57 y/o female with ESRD on HD, HTN, DM, anemia of CKD, admitted for fall with intraocular hemorrhage. Also developed hypoxia with large right pleural effusion.    Assessment/Plan: Right Intraocular hemorrhage -ophthalmologist Dr. Gwen Pounds saw patient. She defers surgery at this point.  Continue Atropine drops/gatifloxacin drops/ Pred forte/Tobramycin.  Hypoxia -unclear etiology. Has large right pleural effusion  on CT scan. Not improved with HD. concern for shunt -Thoracentesis done today with 800 cc tan fluid. Suggests transudate. Follow cx and cytology PFT with DLCO done today. 2D echo with bubble study shows EF of 70%. comments study not adequate to assess PFO.  End-stage renal disease HD on T/Th/Sat  continue calcium carbonate and Sensipar  HTN Continue Felodipine 10 mg.   Diabetes mellitus  patient is on 70/30 insulin BID. Noted for hypoglycemia this am with fsg in 30s. asymptomatic reduce dose to 12 units BID. Check A1C    Code Status: full Family Communication: mother at bedside Disposition Plan:home once stable   Consultants:  Renal  PCCM  Procedures:  right thoracentesis  Antibiotics:  none  HPI/Subjective: No overnight issues. Has episode of  low fsg this am  Objective: Filed Vitals:   04/02/13 0827 04/02/13 1003 04/02/13 1126 04/02/13 1547  BP: 155/65 136/58 145/55 188/77  Pulse: 79  74 86  Temp: 98.7 F (37.1 C)  98.6 F (37 C) 98 F (36.7 C)  TempSrc: Oral  Oral Oral  Resp: 19  20 18   Height:      Weight:      SpO2: 94%  97% 99%   No intake or output data in the 24 hours ending 04/02/13 1751 Filed Weights   04/01/13 0500 04/01/13 1220 04/02/13 0406  Weight: 74.3 kg (163 lb 12.8 oz) 76 kg (167 lb 8.8 oz) 74.4 kg (164 lb 0.4 oz)    Exam:   General:  Middle aged female in NAD  HEENT:  no pallor, right subconjunctival hemorrhave  Cardiovascular: NS1&S2, no pallor  Respiratory: Diminished rt sided breath sounds  Abdomen: soft, NT, ND, BS+  Musculoskeletal: warm, no edema  CNS: AAOX3  Data Reviewed: Basic Metabolic Panel:  Recent Labs Lab 03/29/13 0430 03/31/13 1258  NA 138 132*  K 3.8 4.2  CL 97 93*  CO2 21 28  GLUCOSE 205* 192*  BUN 33* 45*  CREATININE 6.67* 7.17*  CALCIUM 9.1 10.9*  PHOS  --  4.8*   Liver Function Tests:  Recent Labs Lab 03/29/13 0430 03/31/13 1258 04/02/13 1511  AST 21  --   --   ALT 22  --   --   ALKPHOS 171*  --   --   BILITOT 0.2*  --   --   PROT 7.0  --  7.6  ALBUMIN 3.1* 2.7*  --    No results found for this basename: LIPASE, AMYLASE,  in the last 168 hours No results found for this basename: AMMONIA,  in the last 168 hours CBC:  Recent Labs Lab 03/29/13 0430 03/31/13 1258  WBC 11.2* 11.0*  HGB 11.1* 11.5*  HCT 34.9* 35.5*  MCV 98.6 97.5  PLT 161 177   Cardiac Enzymes: No results found for this basename: CKTOTAL, CKMB, CKMBINDEX, TROPONINI,  in the last 168 hours BNP (last 3 results) No results found for this basename: PROBNP,  in the last 8760 hours CBG:  Recent Labs Lab 04/01/13  1132 04/01/13 1607 04/02/13 0825 04/02/13 1218 04/02/13 1258  GLUCAP 223* 155* 112* 33* 104*    Recent Results (from the past 240 hour(s))  MRSA PCR SCREENING     Status: None   Collection Time    03/28/13  6:51 PM      Result Value Range Status   MRSA by PCR NEGATIVE  NEGATIVE Final   Comment:            The GeneXpert MRSA Assay (FDA     approved for NASAL specimens     only), is one component of a     comprehensive MRSA colonization     surveillance program. It is not     intended to diagnose MRSA     infection nor to guide or     monitor treatment for     MRSA infections.     Studies: Dg Chest 2 View  04/01/2013  *RADIOLOGY REPORT*  Clinical Data: Shortness of breath.  Evaluate for pleural effusion.   CHEST - 2 VIEW  Comparison: Chest x-ray 03/30/2013.  Findings: Left internal jugular PermCath with the tip terminating in the distal superior vena cava.  Lung volumes are low.  Bibasilar opacities (right greater than left) may reflect areas of atelectasis and/or consolidation.  There are trace left and small to moderate right-sided pleural effusions.  Pulmonary vascular crowding, without frank pulmonary edema.  Heart size is upper limits of normal. The patient is rotated to the right on today's exam, resulting in distortion of the mediastinal contours and reduced diagnostic sensitivity and specificity for mediastinal pathology.  Atherosclerosis of the thoracic aorta.  IMPRESSION: 1.  Allowing for slight differences in patient positioning, the radiographic appearance of chest is essentially unchanged, as above.   Original Report Authenticated By: Trudie Reed, [name]    Dg Chest Port 1 View  04/02/2013  *RADIOLOGY REPORT*  Clinical Data: Status post right thoracentesis.  PORTABLE CHEST - 1 VIEW  Comparison: PA and lateral chest 04/01/2013.  Findings: Right pleural effusion is markedly decreased after thoracentesis.  No pneumothorax is identified.  Mild atelectasis is present in the lung bases.  Cardiomegaly is noted.  IMPRESSION: Marked decrease in right pleural effusion after thoracentesis.  No pneumothorax or other new abnormality.   Original Report Authenticated By: Holley Dexter, [name]     Scheduled Meds: . atropine  1 drop Right Eye Q8H  . calcium carbonate  400 mg of elemental calcium Oral TID WC  . cinacalcet  30 mg Oral Q breakfast  . doxercalciferol  8 mcg Intravenous Q T,Th,Sa-HD  . felodipine  5 mg Oral BID  . gatifloxacin  1 drop Right Eye Q6H  . insulin aspart protamine- aspart  12 Units Subcutaneous BID WC  . lanthanum  1,500 mg Oral TID WC  . multivitamin with minerals  1 tablet Oral Daily  . prednisoLONE acetate  1 drop Right Eye Q6H  . sodium chloride  3 mL Intravenous Q12H  .  tobramycin  1 drop Right Eye Q6H   Continuous Infusions:     Time spent:35 minutes    Hartwell Vandiver  [facility] Pager [phone] 7PM-7AM, please contact night-coverage at [email or web address], password [facility] 04/02/2013, 5:51 PM  LOS: 5 days

## 2013-04-02 NOTE — Procedures (Signed)
Thoracentesis Procedure Note  Pre-operative Diagnosis: Right sided pleural effusion.  Post-operative Diagnosis: same  Indications: Right sided pleural effusion.  Procedure Details  Consent: Informed consent was obtained. Risks of the procedure were discussed including: infection, bleeding, pain, pneumothorax.  Under sterile conditions the patient was positioned. Betadine solution and sterile drapes were utilized.  1% buffered lidocaine was used to anesthetize the 7 rib space. Fluid was obtained without any difficulties and minimal blood loss.  A dressing was applied to the wound and wound care instructions were provided.   Findings 800 ml of cloudy pleural chalky tan fluid was obtained. A sample was sent to Pathology for cytogenetics, flow, and cell counts, as well as for infection analysis.  Complications:  None; patient tolerated the procedure well.          Condition: stable  Plan A follow up chest x-ray was ordered. Bed Rest for 1 hours. Tylenol 650 mg. for pain.  Attending Attestation: I was present and scrubbed for the entire procedure.  U/S used in procedure.  Alyson Reedy, [name] [facility] Pulmonary/Critical Care Medicine. Pager: [phone]. After hours pager: [phone].

## 2013-04-02 NOTE — Progress Notes (Signed)
  Echocardiogram 2D Echocardiogram has been performed.  Ellender Hose A 04/02/2013, 3:05 PM

## 2013-04-02 NOTE — Progress Notes (Signed)
Returned from PFT because of vomiting. Test not done. States vomiting due to something she ate off supper tray last night.
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
## 2013-04-02 NOTE — Progress Notes (Signed)
Hypoglycemic Event  CBG:33  Treatment: 15 GM carbohydrate snack  Symptoms: None  Follow-up CBG: Time:1200 CBG Result104  Possible Reasons for Event: Vomiting  Comments/MD notified  Dr. Gonzella Lex on rounds    Rexann Lueras L  Remember to initiate Hypoglycemia Order Set & complete

## 2013-04-02 NOTE — Progress Notes (Signed)
UNABLE to perform PFT.  PT vomiting.

## 2013-04-02 NOTE — Progress Notes (Signed)
CBG 212 

## 2013-04-02 NOTE — Progress Notes (Signed)
Subjective: No complaints, seen by pulm yesterday. 2.5 Kf off with HD yesterday Objective Vital signs in last 24 hours: Filed Vitals:   04/02/13 0500 04/02/13 0827 04/02/13 1003 04/02/13 1126  BP: 155/58 155/65 136/58 145/55  Pulse:  79  74  Temp:  98.7 F (37.1 C)  98.6 F (37 C)  TempSrc:  Oral  Oral  Resp:  19  20  Height:      Weight:      SpO2:  94%  97%   Weight change: -1.4 kg (-3 lb 1.4 oz)  Intake/Output Summary (Last 24 hours) at 04/02/13 1133 Last data filed at 04/01/13 1455  Gross per 24 hour  Intake    240 ml  Output   2500 ml  Net  -2260 ml   Labs: Basic Metabolic Panel:  Recent Labs Lab 03/29/13 0430 03/31/13 1258  NA 138 132*  K 3.8 4.2  CL 97 93*  CO2 21 28  GLUCOSE 205* 192*  BUN 33* 45*  CREATININE 6.67* 7.17*  CALCIUM 9.1 10.9*  PHOS  --  4.8*   Liver Function Tests:  Recent Labs Lab 03/29/13 0430 03/31/13 1258  AST 21  --   ALT 22  --   ALKPHOS 171*  --   BILITOT 0.2*  --   PROT 7.0  --   ALBUMIN 3.1* 2.7*   No results found for this basename: LIPASE, AMYLASE,  in the last 168 hours No results found for this basename: AMMONIA,  in the last 168 hours CBC:  Recent Labs Lab 03/29/13 0430 03/31/13 1258  WBC 11.2* 11.0*  HGB 11.1* 11.5*  HCT 34.9* 35.5*  MCV 98.6 97.5  PLT 161 177   PT/INR: @LABRCNTIP (inr:5)   Scheduled Meds ) . atropine  1 drop Right Eye Q8H  . calcium carbonate  400 mg of elemental calcium Oral TID WC  . cinacalcet  30 mg Oral Q breakfast  . doxercalciferol  8 mcg Intravenous Q T,Th,Sa-HD  . felodipine  5 mg Oral BID  . gatifloxacin  1 drop Right Eye Q6H  . insulin aspart protamine- aspart  24 Units Subcutaneous BID WC  . lanthanum  1,500 mg Oral TID WC  . multivitamin with minerals  1 tablet Oral Daily  . prednisoLONE acetate  1 drop Right Eye Q6H  . sodium chloride  3 mL Intravenous Q12H  . tobramycin  1 drop Right Eye Q6H    Physical Exam:  Blood pressure 145/55, pulse 74, temperature 98.6  F (37 C), temperature source Oral, resp. rate 20, height 5\' 4"  (1.626 m), weight 74.4 kg (164 lb 0.4 oz), SpO2 97.00%.  Gen: less SOB HEENT: EOMI, sclera anicteric, throat clear  Neck: no JVD, no LAN  Chest: bibasilar crackles CV: regular, no rub or gallop, pedal pulses intact  Abdomen: soft, nontender  Ext: trace LE pretibial edema bilat, R foot deformity (chronic), no gangrene or ulceration  Neuro: alert, Ox3, no focal deficit  Access: L IJ tunneled HD cath intact   Outpatient HD: TTS at [facility] HD 3hr 45 min EDW 78.5 kg 2K/2Ca bath Profile 4 L IJ TDC Hectorol 8ug Heparin 5000 unit bolus Venofer 100 mg each HD   Impression/Plan  1. Hypoxemia / R effusion / vol overload- HD tomorrow, max UF as tolerated. Pulm consulted and workup in progress 2. ESRD, cont hd TTS via [facility]- has been cath dependent x 7 years. No heparin HD for now due to #3 3. S/P fall w intraocular hemorrhage 4.  HTN/volume- plendil is only BP med at home, on hold for now 5. Anemia of CKD- Hb 11 not on EPO, IV iron load to finish on 4/22 6. Secondary HPTH- cont sensipar 30/d, Fosrenol 1500mg  ac tid and Tum's ultra 1ac tid, vit D with HD    Vinson Moselle  MD [phone] pgr    [phone] cell 04/02/2013, 11:33 AM

## 2013-04-02 NOTE — Progress Notes (Signed)
Hypoglycemic episode..33 . Awake alert and oriented. Skin warm and dry. CBG done at mom's request because pt was asking for ice.m was nauseated and vomited this am. Repeat cbg 104.

## 2013-04-03 ENCOUNTER — Other Ambulatory Visit (HOSPITAL_COMMUNITY): Payer: Self-pay | Admitting: Radiology

## 2013-04-03 ENCOUNTER — Inpatient Hospital Stay (HOSPITAL_COMMUNITY): Payer: Medicare Other

## 2013-04-03 LAB — BLOOD GAS, ARTERIAL
Acid-Base Excess: 2.5 mmol/L — ABNORMAL HIGH (ref 0.0–2.0)
Acid-Base Excess: 2.5 mmol/L — ABNORMAL HIGH (ref 0.0–2.0)
Drawn by: 222511
FIO2: 1 %
Patient temperature: 98.6
TCO2: 28.5 mmol/L (ref 0–100)
pCO2 arterial: 46.8 mmHg — ABNORMAL HIGH (ref 35.0–45.0)
pCO2 arterial: 45.9 mmHg — ABNORMAL HIGH (ref 35.0–45.0)
pH, Arterial: 7.388 (ref 7.350–7.450)
pO2, Arterial: 145 mmHg — ABNORMAL HIGH (ref 80.0–100.0)

## 2013-04-03 LAB — CBC
MCH: 31.7 pg (ref 26.0–34.0)
MCHC: 32.5 g/dL (ref 30.0–36.0)
Platelets: 180 10*3/uL (ref 150–400)

## 2013-04-03 LAB — BASIC METABOLIC PANEL
Calcium: 13.3 mg/dL (ref 8.4–10.5)
GFR calc non Af Amer: 6 mL/min — ABNORMAL LOW (ref >90)
Sodium: 128 mEq/L — ABNORMAL LOW (ref 135–145)

## 2013-04-03 LAB — GLUCOSE, CAPILLARY: Glucose-Capillary: 173 mg/dL — ABNORMAL HIGH (ref 70–99)

## 2013-04-03 LAB — HEMOGLOBIN A1C: Hgb A1c MFr Bld: 8.5 % — ABNORMAL HIGH (ref <5.7)

## 2013-04-03 MED ORDER — LIDOCAINE HCL (PF) 1 % IJ SOLN: 5.0000 mL | INTRAMUSCULAR | Status: DC | PRN

## 2013-04-03 MED ORDER — INSULIN ASPART 100 UNIT/ML ~~LOC~~ SOLN
8.0000 [IU] | Freq: Once | SUBCUTANEOUS | Status: AC
  Administered 2013-04-03: 8 [IU] via SUBCUTANEOUS

## 2013-04-03 MED ORDER — PENTAFLUOROPROP-TETRAFLUOROETH EX AERO: 1.0000 "application " | INHALATION_SPRAY | CUTANEOUS | Status: DC | PRN

## 2013-04-03 MED ORDER — ALBUTEROL SULFATE (5 MG/ML) 0.5% IN NEBU
2.5000 mg | INHALATION_SOLUTION | Freq: Once | RESPIRATORY_TRACT | Status: AC
  Administered 2013-04-03: 2.5 mg via RESPIRATORY_TRACT
  Filled 2013-04-03: qty 0.5

## 2013-04-03 MED ORDER — LIDOCAINE-PRILOCAINE 2.5-2.5 % EX CREA
1.0000 "application " | TOPICAL_CREAM | CUTANEOUS | Status: DC | PRN
  Filled 2013-04-03: qty 5

## 2013-04-03 MED ORDER — ALTEPLASE 2 MG IJ SOLR
2.0000 mg | Freq: Once | INTRAMUSCULAR | Status: AC | PRN
  Filled 2013-04-03: qty 2

## 2013-04-03 MED ORDER — SODIUM CHLORIDE 0.9 % IV SOLN: 100.0000 mL | INTRAVENOUS | Status: DC | PRN

## 2013-04-03 MED ORDER — HEPARIN SODIUM (PORCINE) 1000 UNIT/ML DIALYSIS
1000.0000 [IU] | INTRAMUSCULAR | Status: DC | PRN
  Filled 2013-04-03: qty 1

## 2013-04-03 MED ORDER — NEPRO/CARBSTEADY PO LIQD
237.0000 mL | ORAL | Status: DC | PRN
  Filled 2013-04-03: qty 237

## 2013-04-03 NOTE — Progress Notes (Addendum)
[facility] PROGRESS NOTE  Connie Christian ZOX:[id number] DOB: 07-07-1956 DOA: 03/28/2013 PCP: No primary provider on file.  Brief narrative:  57 y/o female with ESRD on HD, HTN, DM, anemia of CKD, admitted for fall with intraocular hemorrhage. Also developed hypoxia with large right pleural effusion.   Assessment/Plan:  Right Intraocular hemorrhage  -ophthalmologist Dr. Gwen Pounds saw patient. She defers surgery at this point.  Continue Atropine drops/gatifloxacin drops/ Pred forte/Tobramycin.   Hypoxia  -unclear etiology. Has large right pleural effusion on CT scan. Not improved with HD. concern for shunt . Plan for ABG on RA and 100%Fio2 to check for shunting. -Thoracentesis done on  with 800 cc tan fluid. Suggests transudate. chylothorax per appearance.Follow cx and cytology  PFT with DLCO  For today. 2D echo with bubble study shows EF of 70%. comments study not adequate to assess PFO or ASD.  No findings of ILD on CT chest done at [place] per pulmonary.  End-stage renal disease  HD on T/Th/Sat  continue calcium carbonate and Sensipar  Significant hypercalcemia noted on am labs. Had HD today. Check in am  HTN  Continue Felodipine 10 mg.   Diabetes mellitus  patient is on 70/30 insulin BID. Had episode of hypoglycemia on 4/23 , stable after reducing dose to 12 units BID. A1C  Of 8.5  Code Status: full  Family Communication: mother at bedside  Disposition Plan:home once stable    Consultants:  Renal  PCCM   Procedures:  right thoracentesis Antibiotics:  None   HPI/Subjective:  No overnight issues.    Objective: Filed Vitals:   04/03/13 1100 04/03/13 1127 04/03/13 1156 04/03/13 1300  BP: 81/53 80/40 119/46 97/73  Pulse: 69 69 71 79  Temp:   98.1 F (36.7 C)   TempSrc:   Oral   Resp: 16 18 17 15   Height:      Weight:   71.5 kg (157 lb 10.1 oz)   SpO2: 97% 97% 98% 98%    Intake/Output Summary (Last 24 hours) at 04/03/13 1419 Last data filed at  04/03/13 1156  Gross per 24 hour  Intake      0 ml  Output   2599 ml  Net  -2599 ml   Filed Weights   04/02/13 0406 04/03/13 0756 04/03/13 1156  Weight: 74.4 kg (164 lb 0.4 oz) 73.8 kg (162 lb 11.2 oz) 71.5 kg (157 lb 10.1 oz)    Exam:  General: Middle aged female in NAD  HEENT: no pallor, right subconjunctival hemorrhage  Cardiovascular: NS1&S2, no pallor  Respiratory: improved right sided breath sounds, few crackles Abdomen: soft, NT, ND, BS+  Musculoskeletal: warm, no edema  CNS: AAOX3   Data Reviewed: Basic Metabolic Panel:  Recent Labs Lab 03/29/13 0430 03/31/13 1258 04/03/13 0450  NA 138 132* 128*  K 3.8 4.2 4.6  CL 97 93* 91*  CO2 21 28 28   GLUCOSE 205* 192* 134*  BUN 33* 45* 45*  CREATININE 6.67* 7.17* 6.96*  CALCIUM 9.1 10.9* 13.3*  PHOS  --  4.8*  --    Liver Function Tests:  Recent Labs Lab 03/29/13 0430 03/31/13 1258 04/02/13 1511  AST 21  --   --   ALT 22  --   --   ALKPHOS 171*  --   --   BILITOT 0.2*  --   --   PROT 7.0  --  7.6  ALBUMIN 3.1* 2.7*  --    No results found for this basename: LIPASE, AMYLASE,  in the last 168 hours No results found for this basename: AMMONIA,  in the last 168 hours CBC:  Recent Labs Lab 03/29/13 0430 03/31/13 1258 04/03/13 0450  WBC 11.2* 11.0* 13.7*  HGB 11.1* 11.5* 12.6  HCT 34.9* 35.5* 38.8  MCV 98.6 97.5 97.5  PLT 161 177 180   Cardiac Enzymes: No results found for this basename: CKTOTAL, CKMB, CKMBINDEX, TROPONINI,  in the last 168 hours BNP (last 3 results) No results found for this basename: PROBNP,  in the last 8760 hours CBG:  Recent Labs Lab 04/02/13 1218 04/02/13 1258 04/02/13 1623 04/02/13 2146 04/03/13 0718  GLUCAP 33* 104* 212* 148* 173*    Recent Results (from the past 240 hour(s))  MRSA PCR SCREENING     Status: None   Collection Time    03/28/13  6:51 PM      Result Value Range Status   MRSA by PCR NEGATIVE  NEGATIVE Final   Comment:            The GeneXpert MRSA  Assay (FDA     approved for NASAL specimens     only), is one component of a     comprehensive MRSA colonization     surveillance program. It is not     intended to diagnose MRSA     infection nor to guide or     monitor treatment for     MRSA infections.  BODY FLUID CULTURE     Status: None   Collection Time    04/02/13  3:28 PM      Result Value Range Status   Specimen Description PLEURAL RIGHT FLUID   Final   Special Requests FLUID   Final   Gram Stain     Final   Value: MODERATE WBC PRESENT,BOTH PMN AND MONONUCLEAR     NO ORGANISMS SEEN   Culture PENDING   Incomplete   Report Status PENDING   Incomplete  FUNGUS CULTURE W SMEAR     Status: None   Collection Time    04/02/13  3:28 PM      Result Value Range Status   Specimen Description PLEURAL FLUID RIGHT   Final   Special Requests FLUID   Final   Fungal Smear NO YEAST OR FUNGAL ELEMENTS SEEN   Final   Culture CULTURE IN PROGRESS FOR FOUR WEEKS   Final   Report Status PENDING   Incomplete     Studies: Dg Chest Port 1 View  04/02/2013  *RADIOLOGY REPORT*  Clinical Data: Status post right thoracentesis.  PORTABLE CHEST - 1 VIEW  Comparison: PA and lateral chest 04/01/2013.  Findings: Right pleural effusion is markedly decreased after thoracentesis.  No pneumothorax is identified.  Mild atelectasis is present in the lung bases.  Cardiomegaly is noted.  IMPRESSION: Marked decrease in right pleural effusion after thoracentesis.  No pneumothorax or other new abnormality.   Original Report Authenticated By: Holley Dexter, [name]     Scheduled Meds: . albuterol  2.5 mg Nebulization Once  . atropine  1 drop Right Eye Q8H  . cinacalcet  30 mg Oral Q breakfast  . felodipine  5 mg Oral BID  . gatifloxacin  1 drop Right Eye Q6H  . insulin aspart protamine- aspart  12 Units Subcutaneous BID WC  . lanthanum  1,500 mg Oral TID WC  . multivitamin with minerals  1 tablet Oral Daily  . prednisoLONE acetate  1 drop Right Eye Q6H   . sodium  chloride  3 mL Intravenous Q12H  . tobramycin  1 drop Right Eye Q6H   Continuous Infusions:     Time spent: 35 minutes    Kalan Yeley  [facility] Pager [phone]. If 7PM-7AM, please contact night-coverage at [email or web address], password [facility] 04/03/2013, 2:19 PM  LOS: 6 days

## 2013-04-03 NOTE — Progress Notes (Signed)
Patient ID: Connie Christian, female   DOB: May 19, 1956, 57 y.o.   MRN: [id number]   [facility] Progress Note    Subjective:   Reports to be doing fair and tolerating HD without problems   Objective:   BP 158/125  Pulse 73  Temp(Src) 98.8 F (37.1 C) (Oral)  Resp 20  Ht 5\' 4"  (1.626 m)  Wt 74.4 kg (164 lb 0.4 oz)  BMI 28.14 kg/m2  SpO2 95%  Physical Exam: NWG:NFAOZHYQMVH on HD QIO:NGEXB RRR, normal S1 and S2  Resp:CTA bilaterally, no rales/rhonchi MWU:XLKG, obese, NT, BS normal Ext:No LE edema  Labs: BMET  Recent Labs Lab 03/29/13 0430 03/31/13 1258 04/03/13 0450  NA 138 132* 128*  K 3.8 4.2 4.6  CL 97 93* 91*  CO2 21 28 28   GLUCOSE 205* 192* 134*  BUN 33* 45* 45*  CREATININE 6.67* 7.17* 6.96*  CALCIUM 9.1 10.9* 13.3*  PHOS  --  4.8*  --    CBC  Recent Labs Lab 03/29/13 0430 03/31/13 1258 04/03/13 0450  WBC 11.2* 11.0* 13.7*  HGB 11.1* 11.5* 12.6  HCT 34.9* 35.5* 38.8  MCV 98.6 97.5 97.5  PLT 161 177 180    Medications:    . atropine  1 drop Right Eye Q8H  . calcium carbonate  400 mg of elemental calcium Oral TID WC  . cinacalcet  30 mg Oral Q breakfast  . doxercalciferol  8 mcg Intravenous Q T,Th,Sa-HD  . felodipine  5 mg Oral BID  . gatifloxacin  1 drop Right Eye Q6H  . insulin aspart protamine- aspart  12 Units Subcutaneous BID WC  . lanthanum  1,500 mg Oral TID WC  . multivitamin with minerals  1 tablet Oral Daily  . prednisoLONE acetate  1 drop Right Eye Q6H  . sodium chloride  3 mL Intravenous Q12H  . tobramycin  1 drop Right Eye Q6H     Assessment/ Plan:   1. Hypoxemia / R effusion / vol overload- HD today, with attempts at UF as tolerated. Pulm consulted and thoracentesis done yesterday ( turbid fluid sent for analysis---findings so far suggestive of an exudative effusion) 2. ESRD, cont hd TTS via [facility]- has been cath dependent x 7 years. No heparin HD for now due to #3 3. S/P fall w intraocular hemorrhage- will  find out the safety of using heparin on HD 4. HTN/volume- plendil is only BP med at home, on hold for now 5. Anemia of CKD- Hb 11 not on EPO, IV iron load to finish on 4/22 6. Secondary HPTH- cont sensipar 30/d, Fosrenol 1500mg  ac tid and Tum's ultra 1ac tid, vit D with HD---significantly hypercalcemic- will DC tums and hold Hectorol for now. Recheck PTH    Zetta Bills, MD 04/03/2013, 8:51 AM

## 2013-04-03 NOTE — Progress Notes (Addendum)
PFT attempted. PT unable to perform  DLCO despite several attempts.  FVC results questionable due to Pts inability to perform according to ATS standards.  Unconfirmed results placed in Progress Notes in Shadow Chart.

## 2013-04-03 NOTE — Progress Notes (Addendum)
CRITICAL VALUE ALERT  Critical value received:  Ca 13.3  Date of notification:  04/03/13   Time of notification:  0653  Critical value read back:yes  Nurse who received alert:  Ferd Hibbs  MD notified (1st page):  Kirtland Bouchard Schorr  Time of first page:  0654  MD notified (2nd page):Dhungel  Time of second page:0750  Responding ZO:[id number]    Time MD responded:0752
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
## 2013-04-04 LAB — PARATHYROID HORMONE, INTACT (NO CA): PTH: 100.2 pg/mL — ABNORMAL HIGH (ref 14.0–72.0)

## 2013-04-04 LAB — GLUCOSE, CAPILLARY
Glucose-Capillary: 334 mg/dL — ABNORMAL HIGH (ref 70–99)
Glucose-Capillary: 247 mg/dL — ABNORMAL HIGH (ref 70–99)
Glucose-Capillary: 302 mg/dL — ABNORMAL HIGH (ref 70–99)
Glucose-Capillary: 385 mg/dL — ABNORMAL HIGH (ref 70–99)

## 2013-04-04 MED ORDER — INSULIN ASPART 100 UNIT/ML ~~LOC~~ SOLN
0.0000 [IU] | Freq: Three times a day (TID) | SUBCUTANEOUS | Status: DC
  Administered 2013-04-05 (×2): 5 [IU] via SUBCUTANEOUS
  Administered 2013-04-06: 08:00:00 via SUBCUTANEOUS

## 2013-04-04 MED ORDER — INSULIN ASPART PROT & ASPART (70-30 MIX) 100 UNIT/ML ~~LOC~~ SUSP: 15.0000 [IU] | Freq: Two times a day (BID) | SUBCUTANEOUS | Status: DC

## 2013-04-04 MED ORDER — INSULIN ASPART PROT & ASPART (70-30 MIX) 100 UNIT/ML ~~LOC~~ SUSP
15.0000 [IU] | Freq: Two times a day (BID) | SUBCUTANEOUS | Status: DC
  Administered 2013-04-04 – 2013-04-05 (×3): 15 [IU] via SUBCUTANEOUS

## 2013-04-04 NOTE — Progress Notes (Signed)
Vascular Ultrasound Transcranial Doppler with Bubbles has been completed.    04/04/2013 12:28 PM Gertie Fey, RDMS, RDCS

## 2013-04-04 NOTE — Consult Note (Signed)
[facility] [address] [place]. [place] [address] [phone]       Transcranial Doppler Study Consult  Ms. [name] Date of Birth:  January 27, 1956 Medical Record Number:  [id number]   Referring MD:  Dr Molli Knock  Reason for Referral:  TCD bubble study for intracradiac shunt detection  HPI: 57 y/o F, never smoker, admitted on 4/18 after a syncopal episode striking her head with resultant intra-occular bleed.  During admit, patient was noted to have desaturations while sleeping and with ambulation.  PCCM consulted for hypoxemia. No obvious etiology for her hypoxia has been found hence transcanal Doppler bubble study has been requested to evaluate for intracardiac or intrapulmonary shunting. Patient denies any history of strokes, TIAs, migraine headaches or any significant neurological problems.   ROS:   14 system review of systems is positive for  syncope and shortness of breath only PMH:  Past Medical History  Diagnosis Date  . ESRD (end stage renal disease) on dialysis     started 12/2005  . Hypertension   . DM (diabetes mellitus)   . Anemia of chronic kidney failure   . Hyperparathyroidism due to renal insufficiency   . Closed right ankle fracture   . Ulcer of right foot   . 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 Marital Status: Married    Spouse Name: N/A    Number of Children: N/A  . Years of Education: N/A   Occupational History  . Not on file.   Social History Main Topics  . Smoking status: Never Smoker   . Smokeless tobacco: Not on file  . Alcohol Use: Not on file  . Drug Use: Not on file  . Sexually Active: Not on file   Other Topics Concern  . Not on file   Social History Narrative  . No narrative on file    Medications:   No current facility-administered medications on file prior to encounter.   No current outpatient prescriptions on file prior to encounter.    Allergies:    Allergies  Allergen Reactions  . Amoxicillin Itching and Rash  . Ciprofloxacin Itching and Rash  . Daptomycin Itching and Rash  . Iron Dextran     hypotension  . Penicillins Itching and Rash  . Vancomycin Itching, Rash and Other (See Comments)    Pt states that this medications caused her "skin to peel"   Filed Vitals:   04/04/13 1142  BP: 165/54  Pulse: 78  Temp: 99 F (37.2 C)  Resp: 17   Physical Exam General: obese middle aged Caucasian lady, seated, in no evident distress Head: head normocephalic Rt eye bandaged. Neck: supple with no carotid or supraclavicular bruits Cardiovascular: regular rate and rhythm, no murmurs Musculoskeletal: no deformity Skin:  no rash/petichiae Vascular:  Normal pulses all extremities  Neurologic Exam Mental Status: Awake and fully alert. Oriented to place and time. Recent and remote memory intact. Attention span, concentration and fund of knowledge appropriate. Mood and affect appropriate.  Cranial Nerves: Fundoscopic exam not done. Rt eye cannot be examined. Extraocular movements full without nystagmus.  Hearing intact. Facial sensation intact. Face, tongue, palate moves normally and symmetrically.  Motor: Normal bulk and tone. Normal strength in all tested extremity muscles. Sensory.: intact to tough and pinprick and vibratory.  Coordination: Rapid alternating movements normal in all extremities. Finger-to-nose and heel-to-shin performed accurately bilaterally. Gait and Station: Arises from chair without difficulty. Stance  is normal. Gait demonstrates normal stride length and balance . Able to heel, toe and tandem walk without difficulty.  Reflexes: 1+ and symmetric. Toes downgoing.     ASSESSMENT: 57 year lady with syncopal episode and refractory hypoxemia. Etiology of hypoxia is not clear and intracardiac went up on the shunting needs to be evaluated for    PLAN: Check transcanal Doppler bubble study to look for intracardiac or  intrapulmonary shunt.      [facility]      [address]      [place]. [place] [address] [phone]       TRANSCRANIAL DOPPLER BUBBLE STUDY   Ms. KISSA CAMPOY Date of Birth:  Sep 15, 1956 Medical Record Number:  [id number]   Indications: Diagnostic Date of Procedure: 04/04/13 Clinical History: refractory hypoxia Technical Description:   Transcranial Doppler Bubble Study was performed at the bedside after taking verbal informed consent from the patient and explaining risk/benefits. Both middle cerebral arteries could not be insonated due to technical diffculties hence study was done using left middle cerebral artery insonation only.. And IV line was inserted in the right forearm by the RN using aseptic precautions. Agitated saline injection at rest and after valsalva maneuver didcnot result in high intensity transient signals (HITS).   Impression:  Negative Transcranial Doppler Bubble Study not indicative of right to left intracardiac or intrapulmonary shunt.   Results were explained to the patient. Questions were answered. D/w Dr Molli Knock

## 2013-04-04 NOTE — Progress Notes (Signed)
4/25  CBGs on 4/24  173-442-476 mg/dl    1/61   096-045 mg/dl  Recommend starting Novolog SENSITIVE correction scale AC & HS if CBGs continue greater than 180 mg/dl.  Continue 70/30 insulin twice a day. Smith Mince RN BSN CDE

## 2013-04-04 NOTE — Progress Notes (Signed)
[facility] PROGRESS NOTE  Connie Christian:[id number] DOB: 11/16/1956 DOA: 03/28/2013 PCP: No primary provider on file.  Brief narrative:  57 y/o female with ESRD on HD, HTN, DM, anemia of CKD, admitted for fall with intraocular hemorrhage. Also developed hypoxia with large right pleural effusion.   Assessment/Plan:  Right Intraocular hemorrhage  -ophthalmologist Dr. Gwen Pounds saw patient. She defers surgery at this point.  Continue Atropine drops/gatifloxacin drops/ Pred forte/Tobramycin. Follow up with opthalmology as outpt  Hypoxia  -unclear etiology. Has large right pleural effusion on CT scan. Not improved with HD. concern for shunt .  -Thoracentesis done on with 800 cc tan fluid. Suggests transudate. chylothorax per appearance with elevate TAG .lymphangiograms or thoracic duct embolization are not performed here. Follow cx and cytology . recommend low fat diet Patient did not tolerate PFT with DLCO due to dyspnea . 2D echo with bubble study shows EF of 70%. comments study not adequate to assess PFO or ASD. No findings of ILD on CT chest done at [place] per pulmonary.  -transcranial doppler with bubble  ordered to r/o intracranial shunt -transfer to telemetry.  End-stage renal disease  HD on T/Th/Sat  continue calcium carbonate and Sensipar  Significant hypercalcemia noted on 4/24 . dced tums  And hectoral. Check PTH.  HTN  Continue Felodipine 10 mg.  Diabetes mellitus  patient is on 70/30 insulin BID. Had episode of hypoglycemia on 4/23 , stable after reducing dose to 12 units BID. A1C Of 8.5   Code Status: full  Family Communication: mother at bedside  Disposition Plan:home once stable   Consultants:  Renal  PCCM   Procedures:  right thoracentesis   Antibiotics:  None   HPI/Subjective: Patient seen and examined this am. SOB better. Could not tolerate PFT with DLCO yesterday.  Objective: Filed Vitals:   04/04/13 0744 04/04/13 0900 04/04/13 1000  04/04/13 1142  BP: 159/54 164/64 151/47 165/54  Pulse: 80 72 81 78  Temp: 98.8 F (37.1 C)   99 F (37.2 C)  TempSrc: Oral   Oral  Resp: 16 17 22 17   Height:      Weight:      SpO2: 99% 99% 96% 99%    Intake/Output Summary (Last 24 hours) at 04/04/13 1445 Last data filed at 04/04/13 9562  Gross per 24 hour  Intake    243 ml  Output      0 ml  Net    243 ml   Filed Weights   04/02/13 0406 04/03/13 0756 04/03/13 1156  Weight: 74.4 kg (164 lb 0.4 oz) 73.8 kg (162 lb 11.2 oz) 71.5 kg (157 lb 10.1 oz)    Exam:  General: Middle aged female in NAD  HEENT: no pallor, right subconjunctival hemorrhage  Cardiovascular: NS1&S2, no pallor  Respiratory: improved right sided breath sounds, few crackles  Abdomen: soft, NT, ND, BS+  Musculoskeletal: warm, no edema  CNS: AAOX3   Data Reviewed: Basic Metabolic Panel:  Recent Labs Lab 03/29/13 0430 03/31/13 1258 04/03/13 0450  NA 138 132* 128*  K 3.8 4.2 4.6  CL 97 93* 91*  CO2 21 28 28   GLUCOSE 205* 192* 134*  BUN 33* 45* 45*  CREATININE 6.67* 7.17* 6.96*  CALCIUM 9.1 10.9* 13.3*  PHOS  --  4.8*  --    Liver Function Tests:  Recent Labs Lab 03/29/13 0430 03/31/13 1258 04/02/13 1511  AST 21  --   --   ALT 22  --   --   [facility]  171*  --   --   BILITOT 0.2*  --   --   PROT 7.0  --  7.6  ALBUMIN 3.1* 2.7*  --    No results found for this basename: LIPASE, AMYLASE,  in the last 168 hours No results found for this basename: AMMONIA,  in the last 168 hours CBC:  Recent Labs Lab 03/29/13 0430 03/31/13 1258 04/03/13 0450  WBC 11.2* 11.0* 13.7*  HGB 11.1* 11.5* 12.6  HCT 34.9* 35.5* 38.8  MCV 98.6 97.5 97.5  PLT 161 177 180   Cardiac Enzymes: No results found for this basename: CKTOTAL, CKMB, CKMBINDEX, TROPONINI,  in the last 168 hours BNP (last 3 results) No results found for this basename: PROBNP,  in the last 8760 hours CBG:  Recent Labs Lab 04/03/13 1737 04/03/13 2137 04/04/13 0146 04/04/13 0743  04/04/13 1253  GLUCAP 442* 476* 334* 247* 302*    Recent Results (from the past 240 hour(s))  MRSA PCR SCREENING     Status: None   Collection Time    03/28/13  6:51 PM      Result Value Range Status   MRSA by PCR NEGATIVE  NEGATIVE Final   Comment:            The GeneXpert MRSA Assay (FDA     approved for NASAL specimens     only), is one component of a     comprehensive MRSA colonization     surveillance program. It is not     intended to diagnose MRSA     infection nor to guide or     monitor treatment for     MRSA infections.  BODY FLUID CULTURE     Status: None   Collection Time    04/02/13  3:28 PM      Result Value Range Status   Specimen Description PLEURAL RIGHT FLUID   Final   Special Requests FLUID   Final   Gram Stain     Final   Value: MODERATE WBC PRESENT,BOTH PMN AND MONONUCLEAR     NO ORGANISMS SEEN   Culture NO GROWTH 1 DAY   Final   Report Status PENDING   Incomplete  FUNGUS CULTURE W SMEAR     Status: None   Collection Time    04/02/13  3:28 PM      Result Value Range Status   Specimen Description PLEURAL FLUID RIGHT   Final   Special Requests FLUID   Final   Fungal Smear NO YEAST OR FUNGAL ELEMENTS SEEN   Final   Culture CULTURE IN PROGRESS FOR FOUR WEEKS   Final   Report Status PENDING   Incomplete  AFB CULTURE WITH SMEAR     Status: None   Collection Time    04/02/13  3:31 PM      Result Value Range Status   Specimen Description PLEURAL FLUID RIGHT   Final   Special Requests FLUID   Final   ACID FAST SMEAR NO ACID FAST BACILLI SEEN   Final   Culture     Final   Value: CULTURE WILL BE EXAMINED FOR 6 WEEKS BEFORE ISSUING A FINAL REPORT   Report Status PENDING   Incomplete     Studies: Dg Chest Port 1 View  04/02/2013  *RADIOLOGY REPORT*  Clinical Data: Status post right thoracentesis.  PORTABLE CHEST - 1 VIEW  Comparison: PA and lateral chest 04/01/2013.  Findings: Right pleural effusion is markedly decreased after  thoracentesis.   No pneumothorax is identified.  Mild atelectasis is present in the lung bases.  Cardiomegaly is noted.  IMPRESSION: Marked decrease in right pleural effusion after thoracentesis.  No pneumothorax or other new abnormality.   Original Report Authenticated By: Holley Dexter, [name]     Scheduled Meds: . atropine  1 drop Right Eye Q8H  . cinacalcet  30 mg Oral Q breakfast  . felodipine  5 mg Oral BID  . gatifloxacin  1 drop Right Eye Q6H  . insulin aspart protamine- aspart  12 Units Subcutaneous BID WC  . lanthanum  1,500 mg Oral TID WC  . multivitamin with minerals  1 tablet Oral Daily  . prednisoLONE acetate  1 drop Right Eye Q6H  . sodium chloride  3 mL Intravenous Q12H  . tobramycin  1 drop Right Eye Q6H   Continuous Infusions:      Time spent: 25 minutes    Marilena Trevathan  [facility] Pager [phone] If 7PM-7AM, please contact night-coverage at [email or web address], password [facility] 04/04/2013, 2:45 PM  LOS: 7 days
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
## 2013-04-04 NOTE — Progress Notes (Signed)
Patient ID: Connie Christian, female   DOB: 1956-09-13, 57 y.o.   MRN: [id number]  [facility] Progress Note    Subjective:   Reports to be feeling fair--still has blurry vision in right eye    Objective:   BP 162/68  Pulse 77  Temp(Src) 98.3 F (36.8 C) (Oral)  Resp 16  Ht 5\' 4"  (1.626 m)  Wt 71.5 kg (157 lb 10.1 oz)  BMI 27.04 kg/m2  SpO2 98%  Physical Exam: WUJ:WJXBJYNWGNF resting in bed AOZ:HYQMV RRR, normal S1 and S2 Resp:Decreased BS right base- no rales HQI:ONGE, obese, NT, BS normal Ext:No LE edema  Labs: BMET  Recent Labs Lab 03/29/13 0430 03/31/13 1258 04/03/13 0450  NA 138 132* 128*  K 3.8 4.2 4.6  CL 97 93* 91*  CO2 21 28 28   GLUCOSE 205* 192* 134*  BUN 33* 45* 45*  CREATININE 6.67* 7.17* 6.96*  CALCIUM 9.1 10.9* 13.3*  PHOS  --  4.8*  --    CBC  Recent Labs Lab 03/29/13 0430 03/31/13 1258 04/03/13 0450  WBC 11.2* 11.0* 13.7*  HGB 11.1* 11.5* 12.6  HCT 34.9* 35.5* 38.8  MCV 98.6 97.5 97.5  PLT 161 177 180   Medications:    . atropine  1 drop Right Eye Q8H  . cinacalcet  30 mg Oral Q breakfast  . felodipine  5 mg Oral BID  . gatifloxacin  1 drop Right Eye Q6H  . insulin aspart protamine- aspart  12 Units Subcutaneous BID WC  . lanthanum  1,500 mg Oral TID WC  . multivitamin with minerals  1 tablet Oral Daily  . prednisoLONE acetate  1 drop Right Eye Q6H  . sodium chloride  3 mL Intravenous Q12H  . tobramycin  1 drop Right Eye Q6H     Assessment/ Plan:   1. Hypoxemia / R effusion / vol overload- HD today, with attempts at UF as tolerated. Pulm consulted and thoracentesis done yesterday ( turbid fluid sent for analysis---findings so far suggestive of an transudative effusion) 2. ESRD, cont hd TTS via [facility]- plan for next dialysis tomorrow. No heparin HD for now due to #3 3. S/P fall w intraocular hemorrhage- will find out the safety of using heparin on HD 4. HTN/volume- plendil is only BP med at home, on hold for  now 5. Anemia of CKD- Hb 11 not on EPO, IV iron load to finish on 4/22 6. Secondary HPTH- cont sensipar 30/d, Fosrenol 1500mg  ac tid and Tum's ultra 1ac tid, vit D with HD---significantly hypercalcemic yesterday on labs- will DC tums and hold Hectorol for now. Recheck PTH    Zetta Bills, MD 04/04/2013, 7:48 AM

## 2013-04-05 LAB — RENAL FUNCTION PANEL
Albumin: 2.9 g/dL — ABNORMAL LOW (ref 3.5–5.2)
Chloride: 94 mEq/L — ABNORMAL LOW (ref 96–112)
Creatinine, Ser: 7.13 mg/dL — ABNORMAL HIGH (ref 0.50–1.10)
GFR calc non Af Amer: 6 mL/min — ABNORMAL LOW (ref >90)
Phosphorus: 4.8 mg/dL — ABNORMAL HIGH (ref 2.3–4.6)
Potassium: 4.2 mEq/L (ref 3.5–5.1)

## 2013-04-05 LAB — GLUCOSE, CAPILLARY
Glucose-Capillary: 212 mg/dL — ABNORMAL HIGH (ref 70–99)
Glucose-Capillary: 228 mg/dL — ABNORMAL HIGH (ref 70–99)

## 2013-04-05 LAB — CBC
HCT: 34.9 % — ABNORMAL LOW (ref 36.0–46.0)
MCHC: 33 g/dL (ref 30.0–36.0)
Platelets: 207 10*3/uL (ref 150–400)
RDW: 14.1 % (ref 11.5–15.5)

## 2013-04-05 MED ORDER — INSULIN ASPART PROT & ASPART (70-30 MIX) 100 UNIT/ML ~~LOC~~ SUSP
18.0000 [IU] | Freq: Two times a day (BID) | SUBCUTANEOUS | Status: DC
  Administered 2013-04-06: 18 [IU] via SUBCUTANEOUS

## 2013-04-05 NOTE — Progress Notes (Signed)
[facility] PROGRESS NOTE  Connie Christian UXL:[id number] DOB: May 27, 1956 DOA: 03/28/2013 PCP: No primary provider on file.  Brief narrative:  56 y/o female with ESRD on HD, HTN, DM, anemia of CKD, admitted for fall with intraocular hemorrhage. Also developed hypoxia with large right pleural effusion.   Assessment/Plan:  Right Intraocular hemorrhage  -ophthalmologist Dr. Gwen Pounds saw patient. She defers surgery at this point.  Continue Atropine drops/gatifloxacin drops/ Pred forte/Tobramycin. Follow up with opthalmology as outpt   Hypoxia  -unclear etiology. Has large right pleural effusion on CT scan. Not improved with HD. concern for shunt .  -Thoracentesis done on 4/24  with 800 cc tan fluid. Suggests transudate. chylothorax per appearance with elevate TAG .lymphangiograms or thoracic duct embolization are not performed here.  Culture negative. recommend low fat diet  Patient did not tolerate PFT with DLCO due to dyspnea . 2D echo with bubble study shows EF of 70%. comments study not adequate to assess PFO or ASD. No findings of ILD on CT chest done at [facility] per pulmonary.  -transcranial doppler with bubble ordered to r/o intracranial shunt and was negative -Patient's O2 sat is currently stable. If continues to improve overnight we'll discharge home tomorrow with outpatient pulmonary followup for PFTs.  End-stage renal disease  HD on T/Th/Sat  continue calcium carbonate and Sensipar  Significant hypercalcemia noted on 4/24 . dced tums And hectoral. PT is markedly deviated. The renal team following.   HTN  Continue Felodipine 10 mg.   Diabetes mellitus  patient is on 70/30 insulin BID. Had episode of hypoglycemia on 4/23 , . A1C Of 8.5 . Dose was reduced to 12 units twice a day but blood sugar now elevated. We'll increase the dose to 18 units twice a day.  Code Status: full  Family Communication: mother at bedside  Disposition Plan:home tomorrow with outpatient  followup  Consultants:  Renal  PCCM Procedures:  right thoracentesis Antibiotics:  None   Objective: Filed Vitals:   04/05/13 1000 04/05/13 1024 04/05/13 1038 04/05/13 1740  BP: 94/49 115/57 139/67 128/53  Pulse: 65 66 68 73  Temp:   98.5 F (36.9 C) 98.4 F (36.9 C)  TempSrc:   Oral Oral  Resp: 20 18 20 20   Height:      Weight:   70 kg (154 lb 5.2 oz)   SpO2:   98% 98%    Intake/Output Summary (Last 24 hours) at 04/05/13 1838 Last data filed at 04/05/13 1700  Gross per 24 hour  Intake    360 ml  Output   2788 ml  Net  -2428 ml   Filed Weights   04/04/13 2112 04/05/13 0633 04/05/13 1038  Weight: 74.3 kg (163 lb 12.8 oz) 73.2 kg (161 lb 6 oz) 70 kg (154 lb 5.2 oz)    Exam:  General: Middle aged female in NAD HEENT: no pallor, right subconjunctival hemorrhage Cardiovascular: NS1&S2, no pallor Respiratory: improved right sided breath sounds, few crackles Abdomen: soft, NT, ND, BS+ Musculoskeletal: warm, no edema CNS: AAOX3   Data Reviewed: Basic Metabolic Panel:  Recent Labs Lab 03/31/13 1258 04/03/13 0450 04/05/13 0650  NA 132* 128* 135  K 4.2 4.6 4.2  CL 93* 91* 94*  CO2 28 28 28   GLUCOSE 192* 134* 188*  BUN 45* 45* 63*  CREATININE 7.17* 6.96* 7.13*  CALCIUM 10.9* 13.3* 11.5*  PHOS 4.8*  --  4.8*   Liver Function Tests:  Recent Labs Lab 03/31/13 1258 04/02/13 1511 04/05/13 0650  PROT  --  7.6  --   ALBUMIN 2.7*  --  2.9*   No results found for this basename: LIPASE, AMYLASE,  in the last 168 hours No results found for this basename: AMMONIA,  in the last 168 hours CBC:  Recent Labs Lab 03/31/13 1258 04/03/13 0450 04/05/13 0650  WBC 11.0* 13.7* 11.8*  HGB 11.5* 12.6 11.5*  HCT 35.5* 38.8 34.9*  MCV 97.5 97.5 96.7  PLT 177 180 207   Cardiac Enzymes: No results found for this basename: CKTOTAL, CKMB, CKMBINDEX, TROPONINI,  in the last 168 hours BNP (last 3 results) No results found for this basename: PROBNP,  in the last 8760  hours CBG:  Recent Labs Lab 04/04/13 1253 04/04/13 1713 04/04/13 2115 04/05/13 1120 04/05/13 1703  GLUCAP 302* 497* 385* 212* 228*    Recent Results (from the past 240 hour(s))  MRSA PCR SCREENING     Status: None   Collection Time    03/28/13  6:51 PM      Result Value Range Status   MRSA by PCR NEGATIVE  NEGATIVE Final   Comment:            The GeneXpert MRSA Assay (FDA     approved for NASAL specimens     only), is one component of a     comprehensive MRSA colonization     surveillance program. It is not     intended to diagnose MRSA     infection nor to guide or     monitor treatment for     MRSA infections.  BODY FLUID CULTURE     Status: None   Collection Time    04/02/13  3:28 PM      Result Value Range Status   Specimen Description PLEURAL RIGHT FLUID   Final   Special Requests FLUID   Final   Gram Stain     Final   Value: MODERATE WBC PRESENT,BOTH PMN AND MONONUCLEAR     NO ORGANISMS SEEN   Culture NO GROWTH 2 DAYS   Final   Report Status PENDING   Incomplete  FUNGUS CULTURE W SMEAR     Status: None   Collection Time    04/02/13  3:28 PM      Result Value Range Status   Specimen Description PLEURAL FLUID RIGHT   Final   Special Requests FLUID   Final   Fungal Smear NO YEAST OR FUNGAL ELEMENTS SEEN   Final   Culture CULTURE IN PROGRESS FOR FOUR WEEKS   Final   Report Status PENDING   Incomplete  AFB CULTURE WITH SMEAR     Status: None   Collection Time    04/02/13  3:31 PM      Result Value Range Status   Specimen Description PLEURAL FLUID RIGHT   Final   Special Requests FLUID   Final   ACID FAST SMEAR NO ACID FAST BACILLI SEEN   Final   Culture     Final   Value: CULTURE WILL BE EXAMINED FOR 6 WEEKS BEFORE ISSUING A FINAL REPORT   Report Status PENDING   Incomplete     Studies: No results found.  Scheduled Meds: . atropine  1 drop Right Eye Q8H  . cinacalcet  30 mg Oral Q breakfast  . felodipine  5 mg Oral BID  . gatifloxacin   1 drop Right Eye Q6H  . insulin aspart  0-15 Units Subcutaneous TID WC  . insulin aspart protamine- aspart  15 Units Subcutaneous BID WC  . lanthanum  1,500 mg Oral TID WC  . multivitamin with minerals  1 tablet Oral Daily  . prednisoLONE acetate  1 drop Right Eye Q6H  . sodium chloride  3 mL Intravenous Q12H  . tobramycin  1 drop Right Eye Q6H   Continuous Infusions:     Time spent: 25 minutes    Eddie North  [facility] Pager [phone]*. If 7PM-7AM, please contact night-coverage at [email or web address], password [facility] 04/05/2013, 6:38 PM  LOS: 8 days

## 2013-04-05 NOTE — Procedures (Signed)
Patient seen on Hemodialysis. QB 400, UF goal 4L, 110/52 Treatment adjusted as needed.  Zetta Bills MD [facility]. Office # [phone] Pager # [phone] 9:16 AM

## 2013-04-05 NOTE — Progress Notes (Signed)
Patient ID: Connie Christian, female   DOB: [birth year], 57 y.o.   MRN: [id number]   [facility] Progress Note    Subjective:   Reports to be doing fair and frustrated at still being in the hospital   Objective:   BP 110/52  Pulse 64  Temp(Src) 98 F (36.7 C) (Oral)  Resp 18  Ht 5\' 5"  (1.651 m)  Wt 73.2 kg (161 lb 6 oz)  BMI 26.85 kg/m2  SpO2 96%  Physical Exam: WUJ:WJXBJYNWGNF on HD AOZ:HYQMV RRR, normal S1 and S2 Resp:Decreased BS left base otherwise CTA HQI:ONGE, flat, NT, BS normal Ext:No LE edema  Labs: BMET  Recent Labs Lab 03/31/13 1258 04/03/13 0450 04/05/13 0650  NA 132* 128* 135  K 4.2 4.6 4.2  CL 93* 91* 94*  CO2 28 28 28   GLUCOSE 192* 134* 188*  BUN 45* 45* 63*  CREATININE 7.17* 6.96* 7.13*  CALCIUM 10.9* 13.3* 11.5*  PHOS 4.8*  --  4.8*   CBC  Recent Labs Lab 03/31/13 1258 04/03/13 0450 04/05/13 0650  WBC 11.0* 13.7* 11.8*  HGB 11.5* 12.6 11.5*  HCT 35.5* 38.8 34.9*  MCV 97.5 97.5 96.7  PLT 177 180 207    Medications:    . atropine  1 drop Right Eye Q8H  . cinacalcet  30 mg Oral Q breakfast  . felodipine  5 mg Oral BID  . gatifloxacin  1 drop Right Eye Q6H  . insulin aspart  0-15 Units Subcutaneous TID WC  . insulin aspart protamine- aspart  15 Units Subcutaneous BID WC  . lanthanum  1,500 mg Oral TID WC  . multivitamin with minerals  1 tablet Oral Daily  . prednisoLONE acetate  1 drop Right Eye Q6H  . sodium chloride  3 mL Intravenous Q12H  . tobramycin  1 drop Right Eye Q6H   Assessment/ Plan:   1. Hypoxemia / R effusion / vol overload- HD today, with attempts at UF as tolerated. Pulm consulted and thoracentesis done ( turbid fluid) - analysis points to a chylothorax but definitive management cannot be performed here. Possible DC later today 2. ESRD, continue HD TTS via [facility]-. No heparin HD for now due to #3 3. S/P fall w intraocular hemorrhage- Plans noted for OP f/u at Ophthalmology clinic in  [place] 4. HTN/volume- plendil is only BP med at home, on hold for now 5. Anemia of CKD- Hb 11 not on EPO, IV iron load to finish on 4/22 6. Secondary HPTH- cont sensipar 30/d, Fosrenol 1500mg  ac tid and Tum's ultra 1ac tid, vit D with HD---significantly hypercalcemia on prior labs- DC'd tums and holding Hectorol for now. Inappropriately high PTH noted.   Zetta Bills, MD 04/05/2013, 9:09 AM

## 2013-04-06 DIAGNOSIS — I898 Other specified noninfective disorders of lymphatic vessels and lymph nodes: Secondary | ICD-10-CM | POA: Diagnosis present

## 2013-04-06 LAB — BODY FLUID CULTURE

## 2013-04-06 MED ORDER — INSULIN NPH ISOPHANE & REGULAR (70-30) 100 UNIT/ML ~~LOC~~ SUSP: 18.0000 [IU] | Freq: Two times a day (BID) | SUBCUTANEOUS | Status: DC

## 2013-04-06 MED ORDER — NEPRO/CARBSTEADY PO LIQD: 237.0000 mL | ORAL | Status: AC | PRN

## 2013-04-06 NOTE — Progress Notes (Signed)
04/06/13 1305 nsg 02 walking 93% without O2

## 2013-04-06 NOTE — Progress Notes (Signed)
Patient ID: Connie Christian, female   DOB: 11/30/1956, 57 y.o.   MRN: [id number]   [facility] Progress Note    Subjective:   Reports to be feeling fair and excited about being discharged today.    Objective:   BP 156/63  Pulse 81  Temp(Src) 97.9 F (36.6 C) (Oral)  Resp 18  Ht 5\' 5"  (1.651 m)  Wt 70 kg (154 lb 5.2 oz)  BMI 25.68 kg/m2  SpO2 96%  Physical Exam: Gen: Comfortably resting in bed, watching television. Right eye patch noted CVS: Pulse regular in rate and rhythm, heart sounds S1 and S2 normal Resp: Clear to auscultation bilaterally, no rales/rhonchi Abd: Soft, obese, nontender without any organomegaly Ext: No lower extremity edema  Labs: BMET  Recent Labs Lab 03/31/13 1258 04/03/13 0450 04/05/13 0650  NA 132* 128* 135  K 4.2 4.6 4.2  CL 93* 91* 94*  CO2 28 28 28   GLUCOSE 192* 134* 188*  BUN 45* 45* 63*  CREATININE 7.17* 6.96* 7.13*  CALCIUM 10.9* 13.3* 11.5*  PHOS 4.8*  --  4.8*   CBC  Recent Labs Lab 03/31/13 1258 04/03/13 0450 04/05/13 0650  WBC 11.0* 13.7* 11.8*  HGB 11.5* 12.6 11.5*  HCT 35.5* 38.8 34.9*  MCV 97.5 97.5 96.7  PLT 177 180 207   Medications:    . atropine  1 drop Right Eye Q8H  . cinacalcet  30 mg Oral Q breakfast  . felodipine  5 mg Oral BID  . gatifloxacin  1 drop Right Eye Q6H  . insulin aspart  0-15 Units Subcutaneous TID WC  . insulin aspart protamine- aspart  18 Units Subcutaneous BID WC  . lanthanum  1,500 mg Oral TID WC  . multivitamin with minerals  1 tablet Oral Daily  . prednisoLONE acetate  1 drop Right Eye Q6H  . sodium chloride  3 mL Intravenous Q12H  . tobramycin  1 drop Right Eye Q6H    Assessment/ Plan:   1. Hypoxemia / R effusion / vol overload- HD today, with attempts at UF as tolerated. Pulm consulted and thoracentesis done ( turbid fluid) - analysis points to a chylothorax but definitive management cannot be performed here. Will discuss with Dr.Dhungel- may need to pursue  lymphangiography plus/minus thoracic duct embolization if chylothorax is recurrent. If this cannot be done, may need to pleurodesis to avoid recurrent effusions. 2. ESRD, tolerated hemodialysis well yesterday-continue HD TTS upon discharge via [facility]. No heparin HD for now due to #3 3. S/P fall w intraocular hemorrhage- Plans noted for OP f/u at Ophthalmology clinic in [place] 4. HTN/volume- plendil is only BP med at home, on hold for now 5. Anemia of CKD- Hb 11 not on EPO, IV iron load to finish on 4/22 6. Secondary HPTH- cont sensipar 30/d, Fosrenol 1500mg  ac tid and Tum's ultra 1ac tid, vit D with HD---significantly hypercalcemia on prior labs- DC'd tums and holding Hectorol for now. Inappropriately high PTH noted.    Zetta Bills, MD 04/06/2013, 10:12 AM

## 2013-04-06 NOTE — Progress Notes (Signed)
04/06/13. 1150 nsg O2 sat in RA 100%. CM notified

## 2013-04-06 NOTE — Discharge Summary (Signed)
Physician Discharge Summary  Connie Christian ZOX:[id number] DOB: 12-20-1955 DOA: 03/28/2013  PCP: No primary provider on file.  Admit date: 03/28/2013 Discharge date: 04/06/2013  Time spent: 40  minutes  Recommendations for Outpatient Follow-up:  1. D/c home with otpt follow up with opthalmology in [place]. 2. Follow up with Dr Craige Cotta in 2 weeks. 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 Also developed hypoxia with large right pleural effusion.      Hospital Course:  Right Intraocular hemorrhage  -patient was seen by ophthalmologist Dr. Gwen Pounds   defered surgery and recommended to continue fox shield over the eye and outpt opthalmology follow up in [place].  Continue Atropine drops/gatifloxacin drops/ Pred forte/Tobramycin.   Hypoxemia -unclear etiology. Patient also had a  large right pleural effusion on CT scan. Not improved with HD. Concerning for a shunt .  -Pulmonary consulted and a right chest Thoracentesis done on 4/24 with 800 cc tan fluid drained suggestive of  transudate. chylothorax per appearance with elevate TAG . Unfortunately, lymphangiograms , thoracic duct ligation or thoracic duct embolization are not performed here and may need [facility] if this reoccurs in future.. [place]c pleurodesis will be an initial  option too. For now we recommend a low fat diet for her to hold and minimize further chylothorax. Pleural fluid cx are negative. -Work up for possible shunting was done. Patient did not tolerate PFT with DLCO due to dyspnea . 2D echo with bubble study shows EF of 70%. comments study not adequate to assess PFO or ASD. No findings of ILD on CT chest done at [facility] per pulmonary.  -transcranial doppler with bubble ordered to r/o intracranial shunt and was negative  -Patient's O2 sat is currently stable on RA. She is clinically stable and will be discharged home with outpt follow up with pulmonary for repeat PFTs.  End-stage renal disease  HD on T/Th/Sat  continue calcium carbonate and Sensipar  Significant hypercalcemia noted on 4/24 . dced tums  PTH is markedly elevated to 100.  renal team following.   HTN  Continue Felodipine 10 mg.    Diabetes mellitus  patient is on 70/30 insulin BID. Had episode of hypoglycemia on 4/23 , . A1C Of 8.5 . Dose was reduced to 12 units twice a day but blood sugar now elevated. dose further adjusted to 18-20 units with meals bid.  Code Status: full  Family Communication: mother at bedside   Consultants:  Renal  PCCM   Procedures:  right thoracentesis   Antibiotics:  None    Discharge Exam: Filed Vitals:   04/05/13 1038 04/05/13 1740 04/05/13 2114 04/06/13 0526  BP: 139/67 128/53 166/62 156/63  Pulse: 68 73 71 81  Temp: 98.5 F (36.9 C) 98.4 F (36.9 C) 98.7 F (37.1 C)  97.9 F (36.6 C)  TempSrc: Oral Oral Oral Oral  Resp: 20 20 18 18   Height:      Weight: 70 kg (154 lb 5.2 oz)     SpO2: 98% 98% 97% 96%    General: Middle aged female in NAD  HEENT: no pallor, right subconjunctival hemorrhage  Cardiovascular: NS1&S2, Respiratory: improved right sided breath sounds, few crackles  Abdomen: soft, NT, ND, BS+  Musculoskeletal: warm, no edema  CNS: AAOX3   Discharge Instructions  Discharge Orders   Future Orders Complete By  Expires     For home use only DME oxygen  As directed     Questions:      Mode or (Route):  Nasal cannula    Liters per Minute:  2    Frequency:  Continuous    Oxygen conserving device:  Yes        Medication List  Stop taking  calcium carbonate 500 MG chewable tablet  Commonly known as:  TUMS - dosed in mg elemental calcium  Chew 3 tablets by mouth 3 (three) times daily with meals.      TAKE these medications       atropine 1 % ophthalmic solution  Place 1 drop into the right eye every 8 (eight) hours.              cinacalcet 30 MG tablet  Commonly known as:  SENSIPAR  Take 30 mg by mouth daily.     eucerin cream  Apply 1 application topically as needed for dry skin.     feeding supplement (NEPRO CARB STEADY) Liqd  Take 237 mLs by mouth as needed (missed meal during dialysis.).     felodipine 10 MG 24 hr tablet  Commonly known as:  PLENDIL  Take 5 mg by mouth 2 (two) times daily.     gatifloxacin 0.5 % Soln  Commonly known as:  ZYMAXID  Place 1 drop into the right eye every 6 (six) hours.     IMODIUM PO  Take 1 tablet by mouth every 4 (four) hours as needed (diarrhea).     insulin NPH-regular (70-30) 100 UNIT/ML injection  Commonly known as:  NOVOLIN 70/30  Inject 18-20 Units into the skin 2 (two) times daily with a meal. Inject 18 units in the morning and 18 units in the evening     lanthanum 1000 MG chewable tablet  Commonly known as:  FOSRENOL  Chew 1,000 mg by mouth 3 (three) times daily with meals.     multivitamin with minerals Tabs  Take 1 tablet by mouth daily.     prednisoLONE acetate 1 % ophthalmic suspension  Commonly known as:  PRED FORTE  Place 1 drop into the right eye every 6 (six) hours.     tobramycin 0.3 % ophthalmic solution  Commonly known as:  TOBREX  Place 1 drop into the right eye every 6 (six) hours.           Follow-up Information   Follow up with SOOD,VINEET, MD. Schedule an appointment as soon as possible for a visit  in 1 week.   Contact information:   [address] [place] [address] [phone]       Follow up with Trish Fountain, MD. Schedule an appointment as soon as possible for a visit in 1 week.   Contact information:   [address] [place] [address] [phone]        The results of  significant diagnostics from this hospitalization (including imaging, microbiology, ancillary and laboratory) are listed below for reference.    Significant Diagnostic Studies: Dg Chest 2 View  04/01/2013  *RADIOLOGY REPORT*  Clinical Data: Shortness of breath.  Evaluate for pleural effusion.  CHEST - 2 VIEW  Comparison: Chest x-ray 03/30/2013.  Findings: Left internal jugular PermCath with the tip terminating in the distal superior vena cava.  Lung volumes are low.  Bibasilar opacities (right greater than left) may reflect areas of atelectasis and/or consolidation.  There are trace left and small to moderate right-sided pleural effusions.  Pulmonary vascular crowding, without frank pulmonary edema.  Heart size is upper limits of normal. The patient is rotated to the right on today's exam, resulting in distortion of the mediastinal contours and reduced diagnostic sensitivity and specificity for mediastinal pathology.  Atherosclerosis of the thoracic aorta.  IMPRESSION: 1.  Allowing for slight differences in patient positioning, the radiographic appearance of chest is essentially unchanged, as above.   Original Report Authenticated By: Trudie Reed, [name]    Dg Chest 2 View  03/30/2013  *RADIOLOGY REPORT*  Clinical Data: Follow up lung fields, status post dialysis.  CHEST - 2 VIEW  Comparison: Chest radiograph performed earlier today at 06:03 a.m.  Findings: There is a persistent small to moderate right pleural effusion, with mildly decreased fluid tracking along the right minor fissure; the change in distribution may reflect positioning. Mildly worsened right basilar  airspace opacification is seen. Underlying vascular congestion is noted.  No definite pneumothorax is identified.  This could reflect slightly worsening asymmetric interstitial edema, or possibly pneumonia.  The cardiomediastinal silhouette is mildly enlarged.  A dual lumen catheter is noted ending about the cavoatrial junction.  No acute osseous abnormalities are seen.  IMPRESSION: Persistent small to moderate right pleural effusion; mildly worsened right basilar airspace opacification seen.  Underlying vascular congestion and mild cardiomegaly noted.  This could reflect slightly worsening asymmetric interstitial edema, or possibly pneumonia.   Original Report Authenticated By: Tonia Ghent, [name]    Dg Chest 2 View  03/29/2013  *RADIOLOGY REPORT*  Clinical Data:  Evaluate pleural effusion  CHEST - 2 VIEW  Comparison: Chest CT 03/28/2013; prior chest x-ray also 03/28/2013  Findings: Stable position of left IJ approach tunneled hemodialysis catheter.  The tip projects over the superior cavoatrial junction. Relatively unchanged appearance of the moderately large right layering pleural effusion.  More fluid tracks within the fissure on today's exam.  Stable cardiomegaly.  Aortic atherosclerosis again noted.  Background pulmonary vascular congestion and likely mild edema.  IMPRESSION:  1.  Slightly increased pulmonary vascular congestion and mild edema. 2.  Similar moderate right pleural effusion with fluid tracking into the fissure 3.  Slightly increased left basilar opacity favored to reflect atelectasis.   Original Report Authenticated By: Malachy Moan, [name]    Dg Chest Port 1 View  04/02/2013  *RADIOLOGY REPORT*  Clinical Data: Status post right thoracentesis.  PORTABLE CHEST - 1 VIEW  Comparison: PA and lateral chest 04/01/2013.  Findings: Right pleural effusion is markedly decreased after thoracentesis.  No pneumothorax is identified.  Mild atelectasis is present in the lung bases.  Cardiomegaly is  noted.  IMPRESSION: Marked decrease in right pleural effusion after thoracentesis.  No pneumothorax or other new abnormality.   Original Report Authenticated By: Holley Dexter, [name]     Microbiology: Recent Results (from the past 240 hour(s))  MRSA PCR SCREENING     Status: None   Collection Time  03/28/13  6:51 PM      Result Value Range Status   MRSA by PCR NEGATIVE  NEGATIVE Final   Comment:            The GeneXpert MRSA Assay (FDA     approved for NASAL specimens     only), is one component of a     comprehensive MRSA colonization     surveillance program. It is not     intended to diagnose MRSA     infection nor to guide or     monitor treatment for     MRSA infections.  BODY FLUID CULTURE     Status: None   Collection Time    04/02/13  3:28 PM      Result Value Range Status   Specimen Description PLEURAL RIGHT FLUID   Final   Special Requests FLUID   Final   Gram Stain     Final   Value: MODERATE WBC PRESENT,BOTH PMN AND MONONUCLEAR     NO ORGANISMS SEEN   Culture NO GROWTH 2 DAYS   Final   Report Status PENDING   Incomplete  FUNGUS CULTURE W SMEAR     Status: None   Collection Time    04/02/13  3:28 PM      Result Value Range Status   Specimen Description PLEURAL FLUID RIGHT   Final   Special Requests FLUID   Final   Fungal Smear NO YEAST OR FUNGAL ELEMENTS SEEN   Final   Culture CULTURE IN PROGRESS FOR FOUR WEEKS   Final   Report Status PENDING   Incomplete  AFB CULTURE WITH SMEAR     Status: None   Collection Time    04/02/13  3:31 PM      Result Value Range Status   Specimen Description PLEURAL FLUID RIGHT   Final   Special Requests FLUID   Final   ACID FAST SMEAR NO ACID FAST BACILLI SEEN   Final   Culture     Final   Value: CULTURE WILL BE EXAMINED FOR 6 WEEKS BEFORE ISSUING A FINAL REPORT   Report Status PENDING   Incomplete     Labs: Basic Metabolic Panel:  Recent Labs Lab 03/31/13 1258 04/03/13 0450 04/05/13 0650  NA 132*  128* 135  K 4.2 4.6 4.2  CL 93* 91* 94*  CO2 28 28 28   GLUCOSE 192* 134* 188*  BUN 45* 45* 63*  CREATININE 7.17* 6.96* 7.13*  CALCIUM 10.9* 13.3* 11.5*  PHOS 4.8*  --  4.8*   Liver Function Tests:  Recent Labs Lab 03/31/13 1258 04/02/13 1511 04/05/13 0650  PROT  --  7.6  --   ALBUMIN 2.7*  --  2.9*   No results found for this basename: LIPASE, AMYLASE,  in the last 168 hours No results found for this basename: AMMONIA,  in the last 168 hours CBC:  Recent Labs Lab 03/31/13 1258 04/03/13 0450 04/05/13 0650  WBC 11.0* 13.7* 11.8*  HGB 11.5* 12.6 11.5*  HCT 35.5* 38.8 34.9*  MCV 97.5 97.5 96.7  PLT 177 180 207   Cardiac Enzymes: No results found for this basename: CKTOTAL, CKMB, CKMBINDEX, TROPONINI,  in the last 168 hours BNP: BNP (last 3 results) No results found for this basename: PROBNP,  in the last 8760 hours CBG:  Recent Labs Lab 04/04/13 2115 04/05/13 1120 04/05/13 1703 04/05/13 2119 04/06/13 0739  GLUCAP 385* 212* 228* 130* 256*  SignedEddie North  [facility] 04/06/2013, 9:42 AM

## 2013-04-07 LAB — PTH, INTACT AND CALCIUM: Calcium, Total (PTH): 11.1 mg/dL — ABNORMAL HIGH (ref 8.4–10.5)

## 2013-04-07 NOTE — Progress Notes (Signed)
   CARE MANAGEMENT NOTE 04/07/2013  Patient:  Connie Christian, Connie Christian   Account Number:  [id number]  Date Initiated:  04/01/2013  Documentation initiated by:  Alvira Philips Assessment:   57 yr-old female adm with dyspnea and intraocular hemorrhage s/p fall; lives with mother, ESRD on HD TTS     Action/Plan:   Anticipated DC Date:  04/06/2013   Anticipated DC Plan:  HOME/SELF CARE      DC Planning Services  CM consult      Choice offered to / List presented to:     DME arranged  BIPAP      DME agency  [facility]        Status of service:  Completed, signed off Medicare Important Message given?   (If response is "NO", the following Medicare IM given date fields will be blank) Date Medicare IM given:   Date Additional Medicare IM given:    Discharge Disposition:    Per UR Regulation:  Reviewed for med. necessity/level of care/duration of stay  If discussed at Long Length of Stay Meetings, dates discussed:    Comments:  04/07/2013 1100 NCM made outreach to Dr Molli Knock and requesting Autopap for home. NCM did contact [facility] rep and will follow up with NCM on guidelines for Autopap. Contacted Resp Therapy supervisor. Pt did not have CPAP or Bipap placed this admission. [facility] cannot set up for CPAP or Bipap without sleep study or info on pt setting in hospital.  Contacted Dr Gonzella Lex and Bipap order cancelled. He wants pt to follow up with Dr. Craige Cotta in 1 to 2 weeks for sleep study. Oxygen was not required at dc, room sat 92%. NCM contacted pt and no voicemail to leave message.  Isidoro Donning RN CCM Case Mgmt phone [phone]  04/06/2013 1245  Recieved call from [facility] that Bipap could not be arranged without setting.  NCM spoke to pt and states she has never used a Bipap or oxygen at home. Pt oxygen drops while she is asleep. Unit RN will follow up with MD on getting settings for Bipap. Explained [facility] will arrange once settings are entered.  Isidoro Donning RN CCM Case  Mgmt phone [phone]

## 2013-04-30 LAB — FUNGUS CULTURE W SMEAR: Fungal Smear: NONE SEEN

## 2013-05-15 LAB — AFB CULTURE WITH SMEAR (NOT AT ARMC): Acid Fast Smear: NONE SEEN

## 2014-01-21 IMAGING — CR DG CHEST 2V
2 series · 2 of 2 positions shown · non-contrast
Comparison: Chest radiograph performed earlier today at [DATE] a.m.

CLINICAL DATA: Follow up lung fields, status post dialysis.

CHEST - 2 VIEW

[w chest lat]
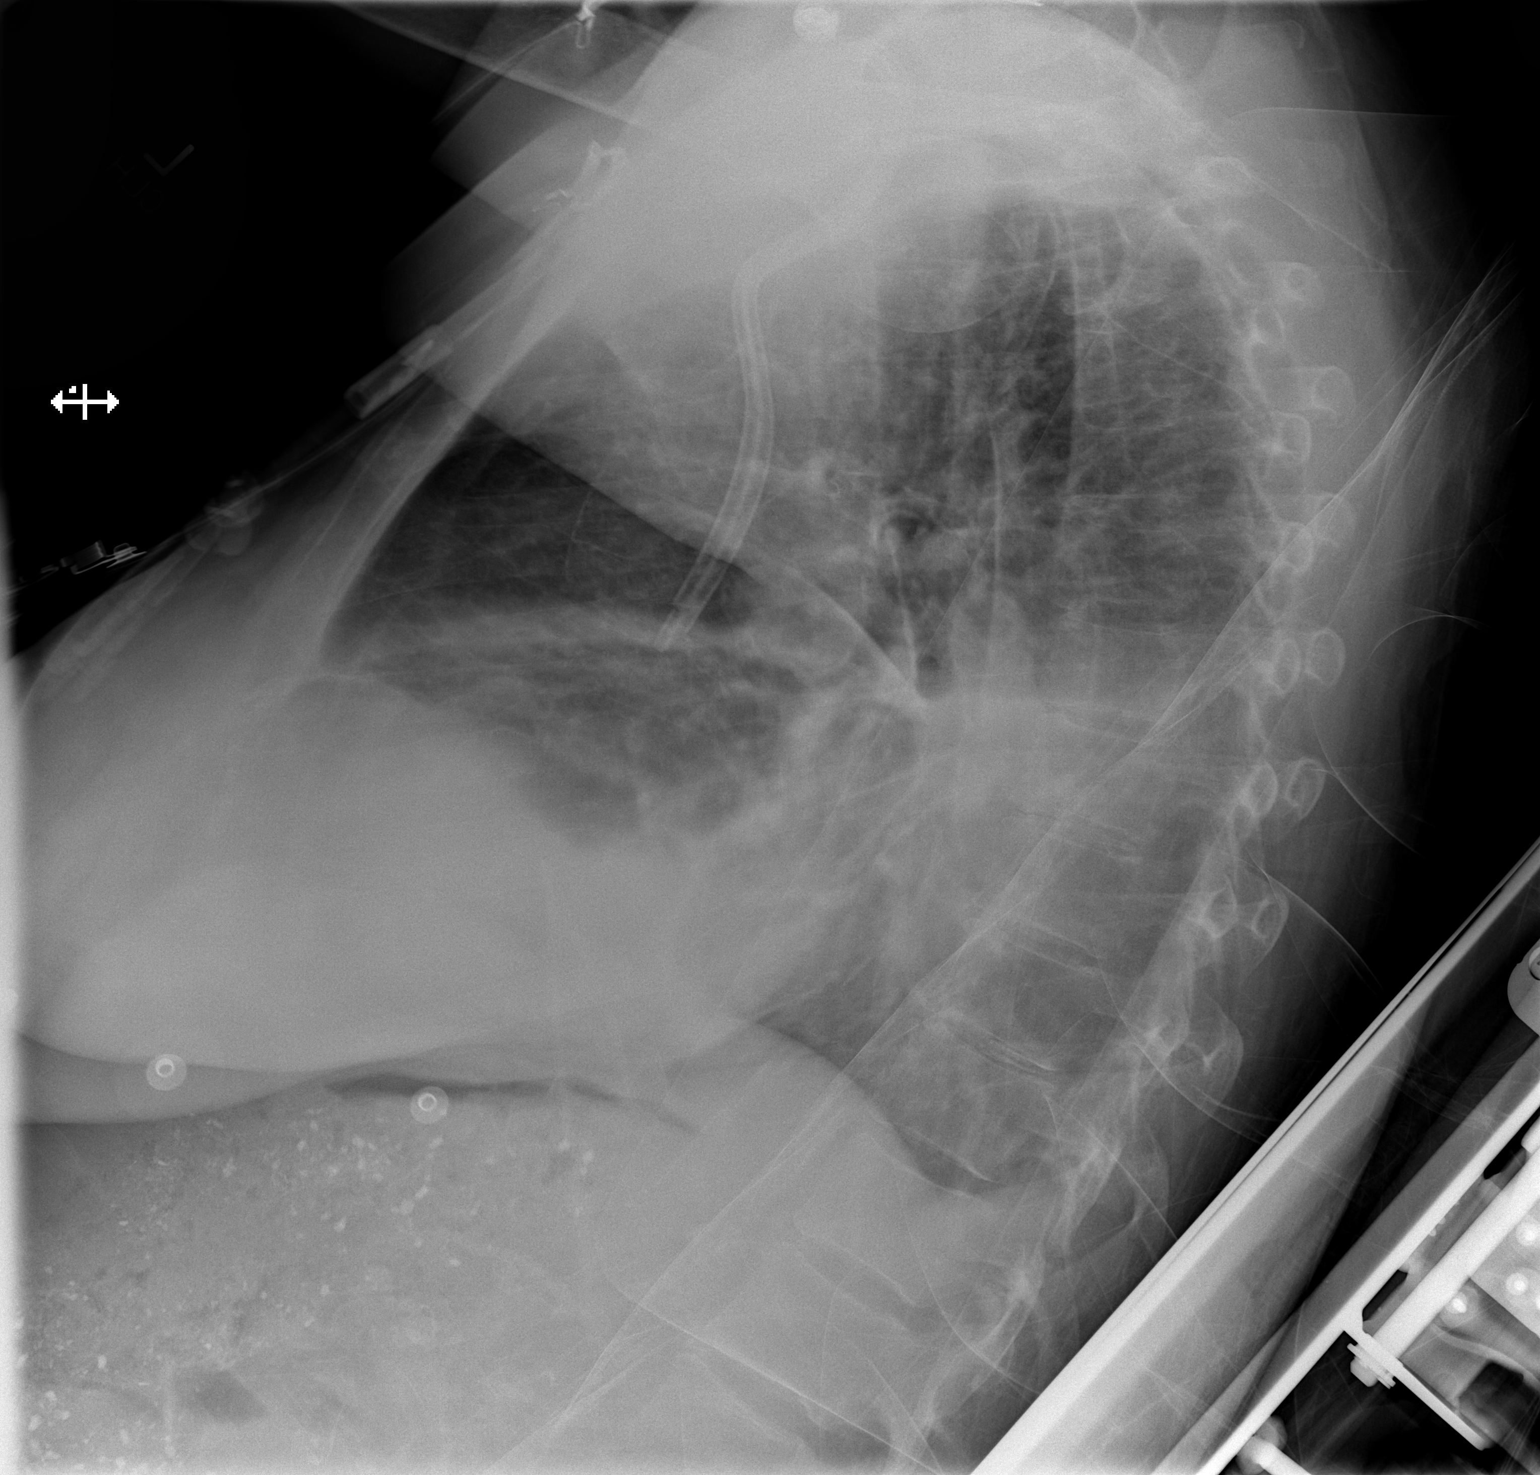

[x chest ap]
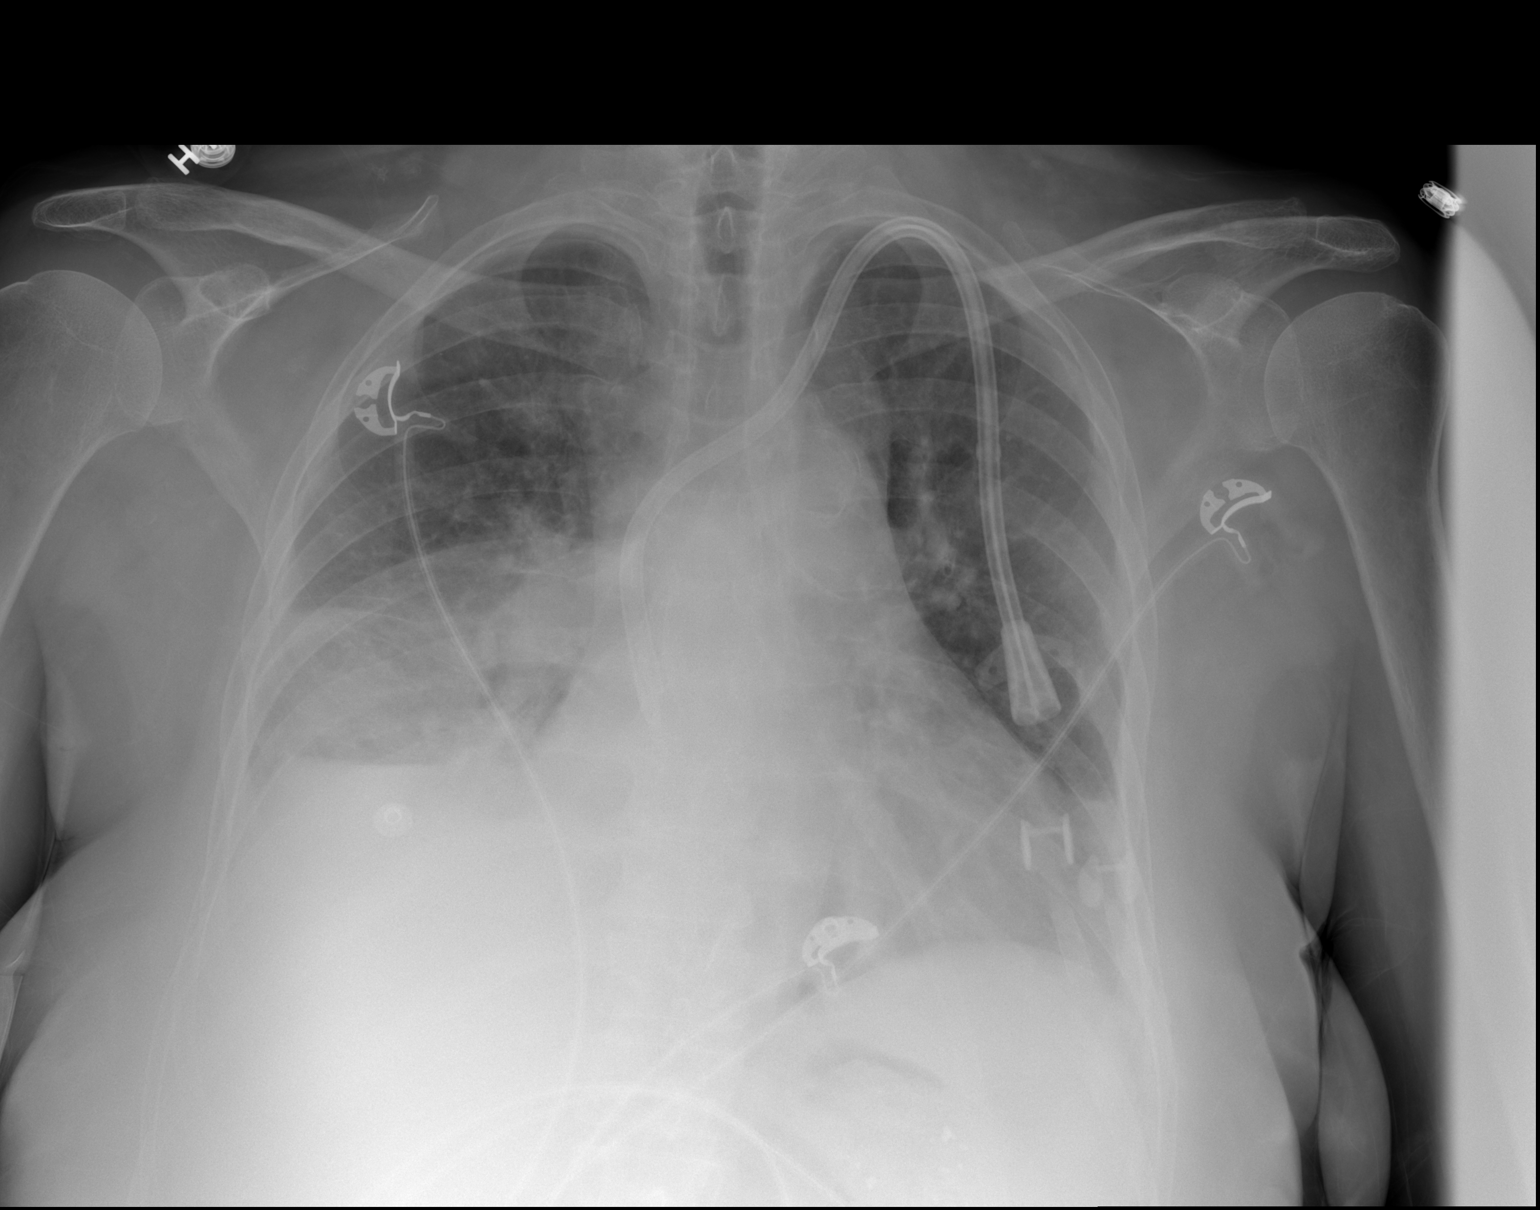

[2 of 2 positions shown; findings below may reference images not displayed]

FINDINGS: There is a persistent small to moderate right pleural
effusion, with mildly decreased fluid tracking along the right
minor fissure; the change in distribution may reflect positioning.
Mildly worsened right basilar airspace opacification is seen.
Underlying vascular congestion is noted.  No definite pneumothorax
is identified.  This could reflect slightly worsening asymmetric
interstitial edema, or possibly pneumonia.

The cardiomediastinal silhouette is mildly enlarged.  A dual lumen
catheter is noted ending about the cavoatrial junction.  No acute
osseous abnormalities are seen.
IMPRESSION: Persistent small to moderate right pleural effusion; mildly
worsened right basilar airspace opacification seen.  Underlying
vascular congestion and mild cardiomegaly noted.  This could
reflect slightly worsening asymmetric interstitial edema, or
possibly pneumonia.

## 2014-01-24 IMAGING — CR DG CHEST 1V PORT
1 series · 1 of 1 positions shown · non-contrast
Comparison: PA and lateral chest 04/01/2013.

CLINICAL DATA: Status post right thoracentesis.

PORTABLE CHEST - 1 VIEW

[AP]
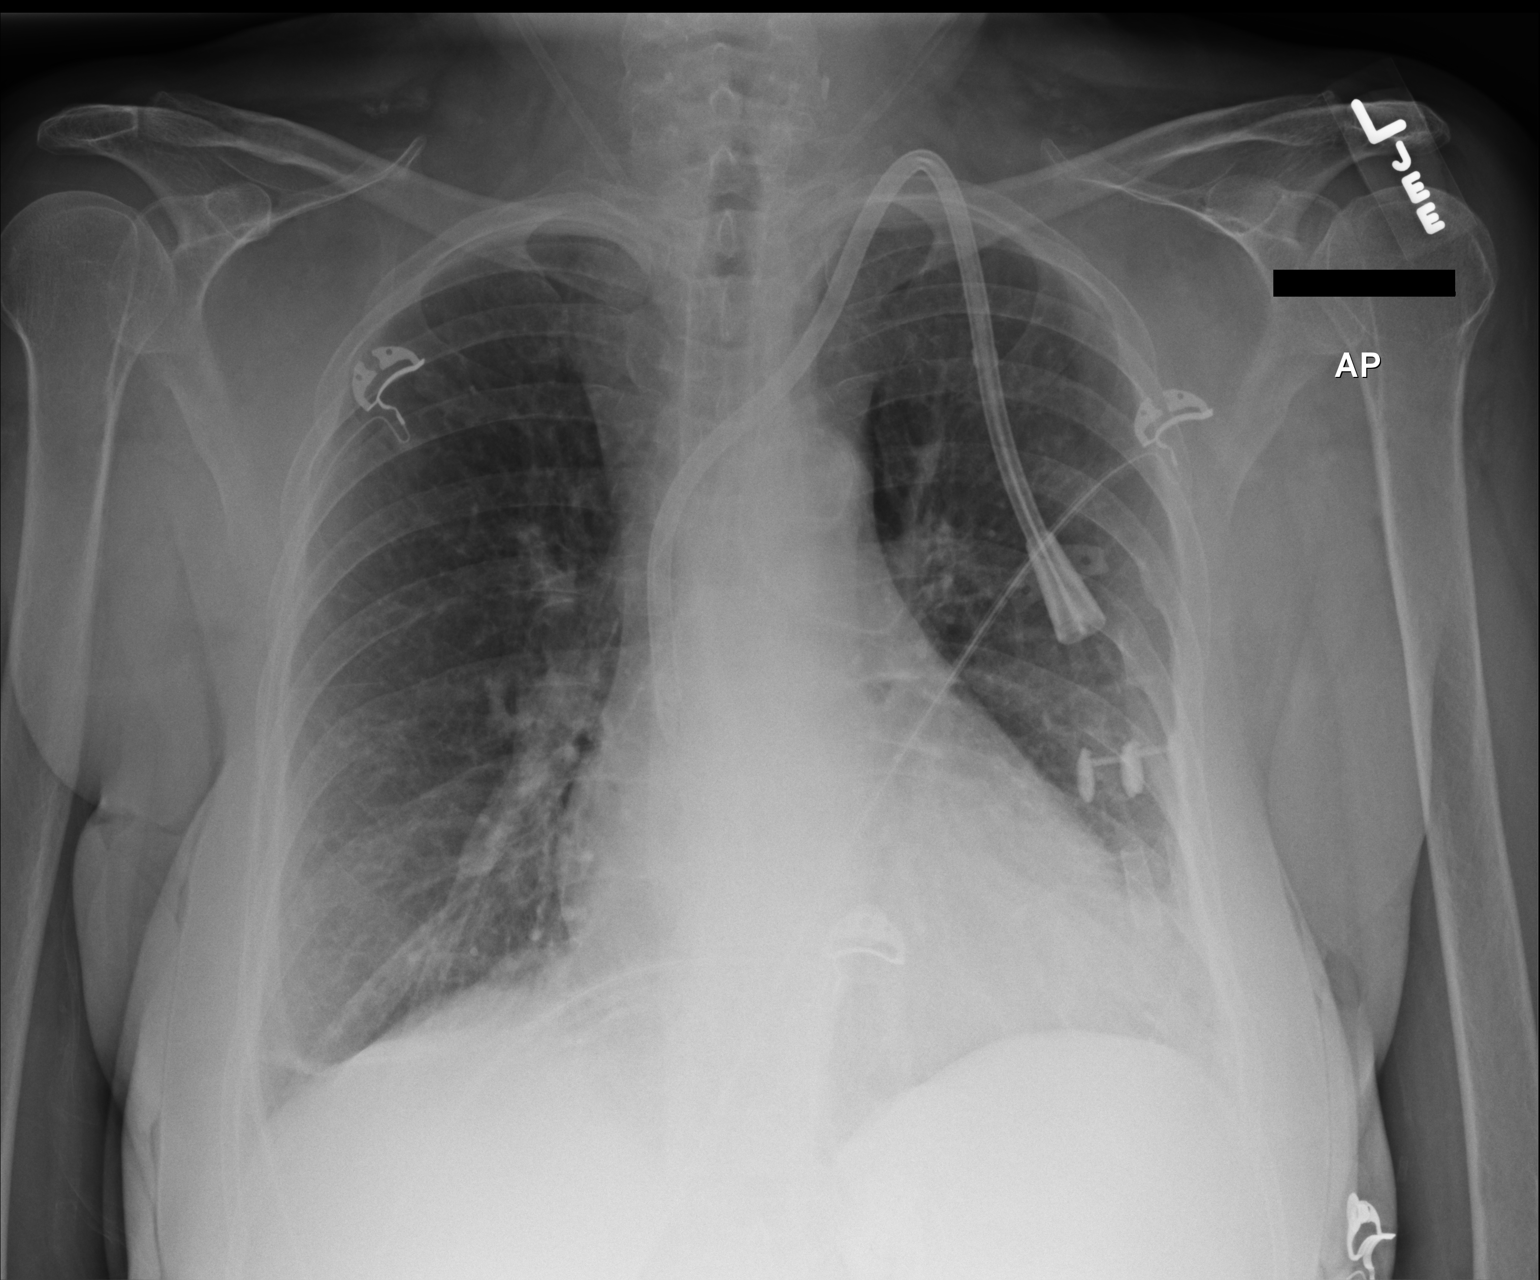

[1 of 1 positions shown; findings below may reference images not displayed]

FINDINGS: Right pleural effusion is markedly decreased after
thoracentesis.  No pneumothorax is identified.  Mild atelectasis is
present in the lung bases.  Cardiomegaly is noted.
IMPRESSION: Marked decrease in right pleural effusion after thoracentesis.  No
pneumothorax or other new abnormality.

## 2014-11-24 ENCOUNTER — Other Ambulatory Visit: Payer: Self-pay | Admitting: *Deleted

## 2014-11-24 DIAGNOSIS — N189 Chronic kidney disease, unspecified: Secondary | ICD-10-CM

## 2014-11-24 DIAGNOSIS — Z01810 Encounter for preprocedural cardiovascular examination: Secondary | ICD-10-CM

## 2014-12-09 ENCOUNTER — Ambulatory Visit: Payer: Medicare Other | Admitting: Vascular Surgery

## 2014-12-09 ENCOUNTER — Other Ambulatory Visit (HOSPITAL_COMMUNITY): Payer: Medicare Other

## 2014-12-09 ENCOUNTER — Encounter (HOSPITAL_COMMUNITY): Payer: Medicare Other

## 2015-12-21 ENCOUNTER — Encounter (HOSPITAL_COMMUNITY): Payer: Self-pay | Admitting: Internal Medicine

## 2015-12-21 ENCOUNTER — Inpatient Hospital Stay (HOSPITAL_COMMUNITY)
Admission: AD | Admit: 2015-12-21 | Discharge: 2015-12-25 | DRG: 637 | Disposition: A | Discharge status: Home / Self Care | Payer: Medicare Other | Source: Other Acute Inpatient Hospital | Attending: Internal Medicine | Admitting: Internal Medicine

## 2015-12-21 DIAGNOSIS — E1122 Type 2 diabetes mellitus with diabetic chronic kidney disease: Secondary | ICD-10-CM | POA: Diagnosis present

## 2015-12-21 DIAGNOSIS — N186 End stage renal disease: Secondary | ICD-10-CM

## 2015-12-21 DIAGNOSIS — E162 Hypoglycemia, unspecified: Secondary | ICD-10-CM | POA: Diagnosis present

## 2015-12-21 DIAGNOSIS — G4733 Obstructive sleep apnea (adult) (pediatric): Secondary | ICD-10-CM

## 2015-12-21 DIAGNOSIS — Z888 Allergy status to other drugs, medicaments and biological substances status: Secondary | ICD-10-CM

## 2015-12-21 DIAGNOSIS — J9601 Acute respiratory failure with hypoxia: Secondary | ICD-10-CM | POA: Diagnosis present

## 2015-12-21 DIAGNOSIS — R7989 Other specified abnormal findings of blood chemistry: Secondary | ICD-10-CM | POA: Diagnosis present

## 2015-12-21 DIAGNOSIS — N189 Chronic kidney disease, unspecified: Secondary | ICD-10-CM

## 2015-12-21 DIAGNOSIS — R001 Bradycardia, unspecified: Secondary | ICD-10-CM

## 2015-12-21 DIAGNOSIS — D631 Anemia in chronic kidney disease: Secondary | ICD-10-CM | POA: Diagnosis present

## 2015-12-21 DIAGNOSIS — I10 Essential (primary) hypertension: Secondary | ICD-10-CM | POA: Diagnosis present

## 2015-12-21 DIAGNOSIS — Z992 Dependence on renal dialysis: Secondary | ICD-10-CM

## 2015-12-21 DIAGNOSIS — E11319 Type 2 diabetes mellitus with unspecified diabetic retinopathy without macular edema: Secondary | ICD-10-CM | POA: Diagnosis present

## 2015-12-21 DIAGNOSIS — Z881 Allergy status to other antibiotic agents status: Secondary | ICD-10-CM

## 2015-12-21 DIAGNOSIS — Z880 Allergy status to penicillin: Secondary | ICD-10-CM

## 2015-12-21 DIAGNOSIS — H5441 Blindness, right eye, normal vision left eye: Secondary | ICD-10-CM | POA: Diagnosis present

## 2015-12-21 DIAGNOSIS — R0902 Hypoxemia: Secondary | ICD-10-CM

## 2015-12-21 DIAGNOSIS — R9431 Abnormal electrocardiogram [ECG] [EKG]: Secondary | ICD-10-CM | POA: Diagnosis present

## 2015-12-21 DIAGNOSIS — I120 Hypertensive chronic kidney disease with stage 5 chronic kidney disease or end stage renal disease: Secondary | ICD-10-CM | POA: Diagnosis present

## 2015-12-21 DIAGNOSIS — E11649 Type 2 diabetes mellitus with hypoglycemia without coma: Principal | ICD-10-CM | POA: Diagnosis present

## 2015-12-21 DIAGNOSIS — R4182 Altered mental status, unspecified: Secondary | ICD-10-CM | POA: Diagnosis present

## 2015-12-21 DIAGNOSIS — Z794 Long term (current) use of insulin: Secondary | ICD-10-CM

## 2015-12-21 DIAGNOSIS — R778 Other specified abnormalities of plasma proteins: Secondary | ICD-10-CM | POA: Diagnosis present

## 2015-12-21 DIAGNOSIS — I517 Cardiomegaly: Secondary | ICD-10-CM | POA: Diagnosis present

## 2015-12-21 DIAGNOSIS — R079 Chest pain, unspecified: Secondary | ICD-10-CM

## 2015-12-21 LAB — GLUCOSE, CAPILLARY: Glucose-Capillary: 116 mg/dL — ABNORMAL HIGH (ref 65–99)

## 2015-12-21 MED ORDER — DEXTROSE 10 % IV SOLN
INTRAVENOUS | Status: DC
  Filled 2015-12-21: qty 1000

## 2015-12-21 MED ORDER — SODIUM CHLORIDE 0.9 % IJ SOLN
3.0000 mL | Freq: Two times a day (BID) | INTRAMUSCULAR | Status: DC
  Administered 2015-12-21 – 2015-12-25 (×8): 3 mL via INTRAVENOUS

## 2015-12-21 MED ORDER — FELODIPINE ER 5 MG PO TB24
5.0000 mg | ORAL_TABLET | Freq: Two times a day (BID) | ORAL | Status: DC
  Administered 2015-12-22 – 2015-12-24 (×5): 5 mg via ORAL
  Filled 2015-12-21 (×8): qty 1

## 2015-12-21 MED ORDER — ONDANSETRON HCL 4 MG/2ML IJ SOLN: 4.0000 mg | Freq: Four times a day (QID) | INTRAMUSCULAR | Status: DC | PRN

## 2015-12-21 MED ORDER — DEXTROSE 10 % IV SOLN
INTRAVENOUS | Status: DC
  Administered 2015-12-21: 23:00:00 via INTRAVENOUS

## 2015-12-21 MED ORDER — CINACALCET HCL 30 MG PO TABS
60.0000 mg | ORAL_TABLET | Freq: Every day | ORAL | Status: DC
  Administered 2015-12-23 – 2015-12-25 (×3): 60 mg via ORAL
  Filled 2015-12-21 (×4): qty 2

## 2015-12-21 MED ORDER — ONDANSETRON HCL 4 MG PO TABS: 4.0000 mg | ORAL_TABLET | Freq: Four times a day (QID) | ORAL | Status: DC | PRN

## 2015-12-21 MED ORDER — ENOXAPARIN SODIUM 30 MG/0.3ML ~~LOC~~ SOLN
30.0000 mg | Freq: Every day | SUBCUTANEOUS | Status: DC
  Administered 2015-12-21 – 2015-12-24 (×4): 30 mg via SUBCUTANEOUS
  Filled 2015-12-21 (×4): qty 0.3

## 2015-12-21 NOTE — Progress Notes (Signed)
[facility] [phone]. VSS. Pt A/O x4. Paged MD for admission orders.

## 2015-12-21 NOTE — H&P (Signed)
[facility] History and Physical  Connie Christian ZOX:[id number] DOB: July 16, 1956 DOA: 12/21/2015  Referring physician:  PCP: No primary care provider on file.   Chief Complaint:   HPI: Connie Christian is a 60 y.o. female with a medical history of ESRD on hemodialysis, type 2 diabetes,  hypertension, diabetic retinopathy (patient is legally blind),  anemia of renal disease who comes referred from [facility] after being taken there via EMS due to altered status from her dialysis center. Her CBG was checked and was found to be initially 23 mg/dL.  Per patient, she does not remember exactly what happened while she was having dialysis. She states that she was able to complete it. She thinks that her symptoms of hypoglycemia might be due to the fact that she use her insulin in the morning and was not able to eat anything during the day. These improve after the patient was given 2 ampules of the dextrose 50% and some snacks to eat.  Workup of [facility] ED revealed an elevated troponin of 1.04 and an abnormal EKG with inverted T waves on lateral leads. However, she denies chest pain, palpitations, dyspnea, diaphoresis, PND or orthopnea. She states that she gets pitting edema lower extremities and postural dizziness on occasion.   When seen, the patient was in no acute distress. She stated that she was hungry. Denied any other complaints.    Review of Systems:  Constitutional:  Positive fatigue.  No weight loss, night sweats, Fevers, chills,  HEENT:  No headaches, Difficulty swallowing,Tooth/dental problems,Sore throat,  No sneezing, itching, ear ache, nasal congestion, post nasal drip,  Cardio-vascular:  No chest pain, Orthopnea, PND, swelling in lower extremities, anasarca, dizziness, palpitations  GI:  No heartburn, indigestion, abdominal pain, nausea, vomiting, diarrhea, change in bowel habits, loss of appetite  Resp:  No shortness of breath with exertion or at rest. No excess  mucus, no productive cough, No non-productive cough, No coughing up of blood.No change in color of mucus.No wheezing.No chest wall deformity  Skin:  no rash or lesions.  GU:  no dysuria, change in color of urine, no urgency or frequency. No flank pain.  Musculoskeletal:  No joint pain or swelling. No decreased range of motion. No back pain.  Psych:  No change in mood or affect. No depression or anxiety. No memory loss.   Past Medical History  Diagnosis Date  . ESRD (end stage renal disease) on dialysis (HCC)     started 12/2005  . Hypertension   . DM (diabetes mellitus) (HCC)   . Anemia of chronic kidney failure   . Hyperparathyroidism due to renal insufficiency (HCC)   . Closed right ankle fracture   . Ulcer of right foot (HCC)   . 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 Orif ankle fracture      right  . I& d foot ulcer      right 2007  . Cataracts      bilateral  . Av fistula placement  08/31/06    left arm - removed after conversion to AVG failed  . Arteriovenous graft placement  09/05/06    removed 09/05/06 - nonmatured  . Hemodialysis catheters  06/16/10, 10/01/12, 11/05/12    patient refuses other access than catheters   Social History:  reports that she has never smoked. She does not have any smokeless tobacco history on file. Her alcohol and drug histories are not on file.  Allergies  Allergen Reactions  . Amoxicillin Itching and Rash  . Ciprofloxacin Itching and Rash  . Daptomycin Itching and Rash  . Iron Dextran     hypotension  . Penicillins Itching and Rash  . Vancomycin Itching, Rash and Other (See Comments)    Pt states that this medications caused her "skin to peel"    Family History  Problem Relation Age of Onset  . Alzheimer's disease Father   . Stroke Maternal Grandmother     Prior to Admission medications   Medication Sig Start Date End Date Taking? Authorizing Provider  atropine 1 %  ophthalmic solution Place 1 drop into the right eye every 8 (eight) hours. 04/01/13   Rhetta MuraJai-Gurmukh Samtani, MD  cinacalcet (SENSIPAR) 30 MG tablet Take 30 mg by mouth daily.    Historical Provider, MD  felodipine (PLENDIL) 10 MG 24 hr tablet Take 5 mg by mouth 2 (two) times daily.    Historical Provider, MD  gatifloxacin (ZYMAXID) 0.5 % SOLN Place 1 drop into the right eye every 6 (six) hours. 04/01/13   Rhetta MuraJai-Gurmukh Samtani, MD  insulin NPH-regular (NOVOLIN 70/30) (70-30) 100 UNIT/ML injection Inject 18-20 Units into the skin 2 (two) times daily with a meal. Inject 22 units in the morning and 20 units in the evening 04/06/13   Nishant Dhungel, MD  lanthanum (FOSRENOL) 1000 MG chewable tablet Chew 1,000 mg by mouth 3 (three) times daily with meals.    Historical Provider, MD  Loperamide HCl (IMODIUM PO) Take 1 tablet by mouth every 4 (four) hours as needed (diarrhea).    Historical Provider, MD  Multiple Vitamin (MULTIVITAMIN WITH MINERALS) TABS Take 1 tablet by mouth daily.    Historical Provider, MD  Nutritional Supplements (FEEDING SUPPLEMENT, NEPRO CARB STEADY,) LIQD Take 237 mLs by mouth as needed (missed meal during dialysis.). 04/06/13   Nishant Dhungel, MD  prednisoLONE acetate (PRED FORTE) 1 % ophthalmic suspension Place 1 drop into the right eye every 6 (six) hours. 04/01/13   Rhetta MuraJai-Gurmukh Samtani, MD  Skin Protectants, Misc. (EUCERIN) cream Apply 1 application topically as needed for dry skin.    Historical Provider, MD  tobramycin (TOBREX) 0.3 % ophthalmic solution Place 1 drop into the right eye every 6 (six) hours. 04/01/13   Rhetta MuraJai-Gurmukh Samtani, MD   Physical Exam: Filed Vitals:   12/21/15 2046  BP: 145/57  Pulse: 71  Temp: 99.3 F (37.4 C)  TempSrc: Oral  Resp: 18  Height: 5\' 5"  (1.651 m)  Weight: 81.3 kg (179 lb 3.7 oz)  SpO2: 97%    Wt Readings from Last 3 Encounters:  12/21/15 81.3 kg (179 lb 3.7 oz)  04/05/13 70 kg (154 lb 5.2 oz)    General:  Appears calm and  comfortable Eyes: Right eye globe scar tissue and blindness. ENT: grossly normal hearing, lips & tongue Neck: no LAD, masses or thyromegaly Cardiovascular: RRR, no m/r/g. Trace LE edema. Telemetry: SR, no arrhythmias  Respiratory: CTA bilaterally, no w/r/r. Normal respiratory effort. Abdomen: soft, ntnd Skin: no rash or induration seen on limited exam Musculoskeletal: grossly normal tone BUE/BLE Psychiatric: grossly normal mood and affect, speech fluent and appropriate Neurologic: Awake alert oriented 3, grossly non-focal.          [facility]:    WBC 11.4 Hemoglobin 11.4 Hematocrit 35.4 Platelets 176 Neutrophils 82.7% Lymphocytes 7.5% Monocytes 8.0%  Prothrombin time 11.3 seconds. INR 1.1 APTT 28.1 seconds  ABG pH 7.380 PCO2   47.0 PCO2   41.0  BEB     0.6 HCO3  27.4 PCO2  29.0 FiO2     21%  Sodium 133 mEq per liter Potassium 3.5, Chloride 93 CO2       25 Glucose 157 BUN 38 Creatinine 6.0 Calcium 8.9 Anion gap 19 Total bilirubin 0.4 AST 51 ALT 54 Alkaline phosphatase 268  Troponin level 0.94   please see transfer labs documents for further detail.    CBG:  Recent Labs Lab 12/21/15 2158  GLUCAP 116*    Radiological Exams on Admission: Chest 2 view Impression: Cardiomegaly with vascular congestion. Suspect early pulmonary edema.  EKG: Independently reviewed. PR interval 174 ms QRS direction 94 ms QT/QTc 418/441 ms P/R/T axes 53  72  -7 Normal sinus rhythm at 66 bpm ST and T wave abnormality, consider inferior ischemia. No previous EKG to compare to.  Assessment/Plan Principal Problem:   Hypoglycemia Admit to telemetry. The patient used her regular dose of insulin this morning, but has eaten very little today. Resume diet and start D10 infusion to prevent further hypoglycemia. Continue CBG monitoring.  Active Problems:    Elevated troponin    Nonspecific abnormal electrocardiogram (ECG) (EKG) The  patient denies any chest pain, dyspnea, dizziness, diaphoresis or palpitations Continue biomarkers trending.  Check echocardiogram in a.m.   consider cardiology consult if troponin continues to trend up    ESRD on dialysis [facility]) Continue hemodialysis as per schedule.    HTN (hypertension) Continue Plendil 10 mg by mouth daily. Monitor blood pressure.    Anemia of renal disease Monitor hematocrit and hemoglobin. Erythropoietin supplementation per nephrology.       Code Status:  full code.  DVT Prophylaxis: Lovenox SQ. Family Communication:  Disposition Plan: Admit to telemetry, monitor CBG and trend troponin levels.  Time spent: Over 70 minutes were spent in the process of his admission.  Bobette Mo [facility] Pager [phone].

## 2015-12-21 NOTE — Progress Notes (Signed)
60 yo with ESRD, DM presents [facility] with confusion. Found to have CBG of 23. Given D50, and repat CBG 50. Given a second amp of D50 with resolution of hypoglycemia, however, still somnolent. CT brain without anything acute. BMET significant for creatinine of 6. Dialyzed yesterday. Troponin 0.9, but no chest pain. EKG with flipped Ts in 2 leads, but no old EKG for comparison. sats 88% on RA, normalize with 2 liters [place]. ABG without significant hypercarbia.  CXR shows vascular congestion. Dr. Littie DeedsGentry [facility], as [facility] not have inpatient [facility]. Accepted to tele.  Nursing:  Call flow manager on arrival to unit. She will page hospitalist to evaluate and write orders.  Connie Curborinna Anicia Leuthold, MD [facility]

## 2015-12-22 ENCOUNTER — Encounter (HOSPITAL_COMMUNITY): Payer: Self-pay | Admitting: Physician Assistant

## 2015-12-22 ENCOUNTER — Observation Stay (HOSPITAL_COMMUNITY): Payer: Medicare Other

## 2015-12-22 DIAGNOSIS — R7989 Other specified abnormal findings of blood chemistry: Secondary | ICD-10-CM | POA: Diagnosis not present

## 2015-12-22 DIAGNOSIS — I10 Essential (primary) hypertension: Secondary | ICD-10-CM

## 2015-12-22 DIAGNOSIS — E162 Hypoglycemia, unspecified: Secondary | ICD-10-CM | POA: Diagnosis not present

## 2015-12-22 DIAGNOSIS — R092 Respiratory arrest: Secondary | ICD-10-CM

## 2015-12-22 DIAGNOSIS — R001 Bradycardia, unspecified: Secondary | ICD-10-CM | POA: Diagnosis not present

## 2015-12-22 DIAGNOSIS — N186 End stage renal disease: Secondary | ICD-10-CM

## 2015-12-22 DIAGNOSIS — Z992 Dependence on renal dialysis: Secondary | ICD-10-CM

## 2015-12-22 LAB — BASIC METABOLIC PANEL
Anion gap: 18 — ABNORMAL HIGH (ref 5–15)
BUN: 41 mg/dL — AB (ref 6–20)
CO2: 20 mmol/L — ABNORMAL LOW (ref 22–32)
CREATININE: 7.18 mg/dL — AB (ref 0.44–1.00)
Calcium: 8.6 mg/dL — ABNORMAL LOW (ref 8.9–10.3)
Chloride: 90 mmol/L — ABNORMAL LOW (ref 101–111)
GFR calc Af Amer: 6 mL/min — ABNORMAL LOW (ref >60)
GFR, EST NON AFRICAN AMERICAN: 5 mL/min — AB (ref >60)
GLUCOSE: 463 mg/dL — AB (ref 65–99)
Potassium: 3.7 mmol/L (ref 3.5–5.1)
SODIUM: 128 mmol/L — AB (ref 135–145)

## 2015-12-22 LAB — CREATININE, SERUM
CREATININE: 6.9 mg/dL — AB (ref 0.44–1.00)
GFR calc Af Amer: 7 mL/min — ABNORMAL LOW (ref >60)
GFR calc non Af Amer: 6 mL/min — ABNORMAL LOW (ref >60)

## 2015-12-22 LAB — POCT I-STAT 3, ART BLOOD GAS (G3+)
ACID-BASE DEFICIT: 3 mmol/L — AB (ref 0.0–2.0)
BICARBONATE: 24.2 meq/L — AB (ref 20.0–24.0)
O2 SAT: 100 %
PO2 ART: 227 mmHg — AB (ref 80.0–100.0)
TCO2: 26 mmol/L (ref 0–100)
pCO2 arterial: 49.8 mmHg — ABNORMAL HIGH (ref 35.0–45.0)
pH, Arterial: 7.295 — ABNORMAL LOW (ref 7.350–7.450)

## 2015-12-22 LAB — COMPREHENSIVE METABOLIC PANEL
ALK PHOS: 202 U/L — AB (ref 38–126)
ALT: 35 U/L (ref 14–54)
ANION GAP: 13 (ref 5–15)
AST: 36 U/L (ref 15–41)
Albumin: 3 g/dL — ABNORMAL LOW (ref 3.5–5.0)
BILIRUBIN TOTAL: 0.5 mg/dL (ref 0.3–1.2)
BUN: 41 mg/dL — ABNORMAL HIGH (ref 6–20)
CALCIUM: 8.2 mg/dL — AB (ref 8.9–10.3)
CO2: 23 mmol/L (ref 22–32)
CREATININE: 7.01 mg/dL — AB (ref 0.44–1.00)
Chloride: 92 mmol/L — ABNORMAL LOW (ref 101–111)
GFR, EST AFRICAN AMERICAN: 7 mL/min — AB (ref >60)
GFR, EST NON AFRICAN AMERICAN: 6 mL/min — AB (ref >60)
Glucose, Bld: 400 mg/dL — ABNORMAL HIGH (ref 65–99)
Potassium: 3.6 mmol/L (ref 3.5–5.1)
Sodium: 128 mmol/L — ABNORMAL LOW (ref 135–145)
TOTAL PROTEIN: 6.7 g/dL (ref 6.5–8.1)

## 2015-12-22 LAB — CBC WITH DIFFERENTIAL/PLATELET
Basophils Absolute: 0 10*3/uL (ref 0.0–0.1)
Basophils Relative: 0 %
EOS ABS: 0.1 10*3/uL (ref 0.0–0.7)
EOS PCT: 1 %
HCT: 34.2 % — ABNORMAL LOW (ref 36.0–46.0)
Hemoglobin: 10.8 g/dL — ABNORMAL LOW (ref 12.0–15.0)
LYMPHS ABS: 1 10*3/uL (ref 0.7–4.0)
Lymphocytes Relative: 9 %
MCH: 33.1 pg (ref 26.0–34.0)
MCHC: 31.6 g/dL (ref 30.0–36.0)
MCV: 104.9 fL — ABNORMAL HIGH (ref 78.0–100.0)
MONO ABS: 0.9 10*3/uL (ref 0.1–1.0)
MONOS PCT: 9 %
Neutro Abs: 8.2 10*3/uL — ABNORMAL HIGH (ref 1.7–7.7)
Neutrophils Relative %: 80 %
PLATELETS: 185 10*3/uL (ref 150–400)
RBC: 3.26 MIL/uL — AB (ref 3.87–5.11)
RDW: 15.2 % (ref 11.5–15.5)
WBC: 10.2 10*3/uL (ref 4.0–10.5)

## 2015-12-22 LAB — GLUCOSE, CAPILLARY
GLUCOSE-CAPILLARY: 396 mg/dL — AB (ref 65–99)
GLUCOSE-CAPILLARY: 429 mg/dL — AB (ref 65–99)
GLUCOSE-CAPILLARY: 426 mg/dL — AB (ref 65–99)
GLUCOSE-CAPILLARY: 234 mg/dL — AB (ref 65–99)
GLUCOSE-CAPILLARY: 48 mg/dL — AB (ref 65–99)
GLUCOSE-CAPILLARY: 90 mg/dL (ref 65–99)
Glucose-Capillary: 300 mg/dL — ABNORMAL HIGH (ref 65–99)
Glucose-Capillary: 181 mg/dL — ABNORMAL HIGH (ref 65–99)

## 2015-12-22 LAB — TROPONIN I
TROPONIN I: 1.69 ng/mL — AB (ref <0.031)
TROPONIN I: 1.34 ng/mL — AB (ref <0.031)
Troponin I: 1.37 ng/mL (ref <0.031)
Troponin I: 1.19 ng/mL (ref <0.031)

## 2015-12-22 LAB — MAGNESIUM: Magnesium: 2.4 mg/dL (ref 1.7–2.4)

## 2015-12-22 LAB — MRSA PCR SCREENING: MRSA by PCR: NEGATIVE

## 2015-12-22 LAB — T4, FREE: Free T4: 0.88 ng/dL (ref 0.61–1.12)

## 2015-12-22 LAB — TSH: TSH: 1.661 u[IU]/mL (ref 0.350–4.500)

## 2015-12-22 MED ORDER — PENTAFLUOROPROP-TETRAFLUOROETH EX AERO
1.0000 | INHALATION_SPRAY | CUTANEOUS | Status: DC | PRN
Start: 2015-12-22 — End: 2015-12-22

## 2015-12-22 MED ORDER — CALCIUM CARBONATE ANTACID 500 MG PO CHEW
600.0000 mg | CHEWABLE_TABLET | Freq: Three times a day (TID) | ORAL | Status: DC
  Administered 2015-12-23 – 2015-12-25 (×9): 600 mg via ORAL
  Filled 2015-12-22 (×8): qty 3

## 2015-12-22 MED ORDER — INSULIN ASPART PROT & ASPART (70-30 MIX) 100 UNIT/ML ~~LOC~~ SUSP
10.0000 [IU] | Freq: Two times a day (BID) | SUBCUTANEOUS | Status: DC
  Filled 2015-12-22: qty 10

## 2015-12-22 MED ORDER — INSULIN ASPART 100 UNIT/ML ~~LOC~~ SOLN
0.0000 [IU] | SUBCUTANEOUS | Status: DC
  Administered 2015-12-22: 3 [IU] via SUBCUTANEOUS
  Administered 2015-12-22: 9 [IU] via SUBCUTANEOUS

## 2015-12-22 MED ORDER — HEPARIN SODIUM (PORCINE) 1000 UNIT/ML DIALYSIS
1000.0000 [IU] | INTRAMUSCULAR | Status: DC | PRN
  Administered 2015-12-22: 1000 [IU] via INTRAVENOUS_CENTRAL
  Filled 2015-12-22: qty 1

## 2015-12-22 MED ORDER — CALCITRIOL 0.25 MCG PO CAPS
0.2500 ug | ORAL_CAPSULE | ORAL | Status: DC
  Administered 2015-12-23 – 2015-12-25 (×2): 0.25 ug via ORAL
  Filled 2015-12-22 (×2): qty 1

## 2015-12-22 MED ORDER — LIDOCAINE HCL (PF) 1 % IJ SOLN: 5.0000 mL | INTRAMUSCULAR | Status: DC | PRN

## 2015-12-22 MED ORDER — LIDOCAINE-PRILOCAINE 2.5-2.5 % EX CREA
1.0000 "application " | TOPICAL_CREAM | CUTANEOUS | Status: DC | PRN
  Filled 2015-12-22: qty 5

## 2015-12-22 MED ORDER — RENA-VITE PO TABS
1.0000 | ORAL_TABLET | Freq: Every day | ORAL | Status: DC
  Administered 2015-12-22 – 2015-12-24 (×3): 1 via ORAL
  Filled 2015-12-22 (×3): qty 1

## 2015-12-22 MED ORDER — CEFAZOLIN SODIUM 1-5 GM-% IV SOLN
1.0000 g | INTRAVENOUS | Status: DC
  Administered 2015-12-23 – 2015-12-24 (×3): 1 g via INTRAVENOUS
  Filled 2015-12-22 (×7): qty 50

## 2015-12-22 MED ORDER — SODIUM CHLORIDE 0.9 % IV SOLN: 100.0000 mL | INTRAVENOUS | Status: DC | PRN

## 2015-12-22 MED ORDER — ASPIRIN 325 MG PO TABS
325.0000 mg | ORAL_TABLET | Freq: Every day | ORAL | Status: DC
  Administered 2015-12-22 – 2015-12-25 (×4): 325 mg via ORAL
  Filled 2015-12-22 (×4): qty 1

## 2015-12-22 MED ORDER — DARBEPOETIN ALFA 60 MCG/0.3ML IJ SOSY: 60.0000 ug | PREFILLED_SYRINGE | INTRAMUSCULAR | Status: DC

## 2015-12-22 MED ORDER — DEXTROSE 50 % IV SOLN
INTRAVENOUS | Status: AC
  Administered 2015-12-22: 25 mL
  Filled 2015-12-22: qty 50

## 2015-12-22 MED ORDER — INSULIN ASPART 100 UNIT/ML ~~LOC~~ SOLN
0.0000 [IU] | Freq: Three times a day (TID) | SUBCUTANEOUS | Status: DC
  Administered 2015-12-23: 3 [IU] via SUBCUTANEOUS
  Administered 2015-12-23: 9 [IU] via SUBCUTANEOUS
  Administered 2015-12-24: 7 [IU] via SUBCUTANEOUS
  Administered 2015-12-24: 9 [IU] via SUBCUTANEOUS
  Administered 2015-12-24: 7 [IU] via SUBCUTANEOUS
  Administered 2015-12-25: 3 [IU] via SUBCUTANEOUS
  Administered 2015-12-25: 7 [IU] via SUBCUTANEOUS

## 2015-12-22 MED ORDER — HEPARIN SODIUM (PORCINE) 1000 UNIT/ML DIALYSIS
20.0000 [IU]/kg | INTRAMUSCULAR | Status: DC | PRN
  Administered 2015-12-22: 1600 [IU] via INTRAVENOUS_CENTRAL
  Filled 2015-12-22: qty 2

## 2015-12-22 MED ORDER — ALTEPLASE 2 MG IJ SOLR
2.0000 mg | Freq: Once | INTRAMUSCULAR | Status: DC | PRN
  Filled 2015-12-22: qty 2

## 2015-12-22 NOTE — Consult Note (Signed)
Cardiology Consultation Note    Patient ID: Connie Christian, MRN: [id number], DOB/AGE: 13-Jul-1956 60 y.o. Admit date: 12/21/2015   Date of Consult: 12/22/2015 Primary Physician: No primary care provider on file. Primary Cardiologist: New - patient transferred from [facility]  Chief Complaint: altered mental status Reason for Consultation: elevated troponin, bradycardia with respiratory event Requesting MD: Maren Reamer, NP  HPI: Connie Christian is a 60 y/o F with h/o ESRD since 2007, HTN, DM, anemia of chronic disease, blindness right eye and no prior cardiac history who presented to [facility] upon transfer from [facility] for altered mental status, hypoglycemia (23) and elevated troponin. Connie Christian is extremely vague when asked why Connie Christian came to the hospital - "I thought I ought to get checked out" - "My mom made me come" (her mom is in her 3s). Connie Christian does not really remember any specific symptoms which prompted her to come in. Per review of notes, Connie Christian was transferred from the dialysis center with altered mental status and blood sugar of 23. The patient was treated with D50 at [facility] and given food. Subsequent labwork showed troponin 0.94->1.02 prompting transfer to [facility] where these rose further to 1.69 then ->1.37-1.34. CT head @ [facility] showed chronic white matter changes but no acute abnormality. This morning the nurse was notified by central telemetry that the patient was bradycardic in the 30s (for about 3 minutes). When they went to check on her, Connie Christian was found to be cyanotic with agonal breathing and unresponsive. Code Blue was called. CBG 396. Dr. Julian Reil was on the unit and responded to the code. Apparently when jaw thrust was performed the patient was noted to be coming to and fully recovered. Connie Christian did not require meds or CPR. Pulse ox 92% on 15L. Connie Christian is able to answer questions, except Connie Christian thinks it's January 8-9 and called this [facility]. Connie Christian denies any recent cardiac symptoms including CP,  SOB, prior syncope, palpitations, diaphoresis. Connie Christian reports chronic diarrhea with dialysis. K this AM is 3.7, Mg 2.4.  Workup @ [place] - Na 133, K 3.5, Cl 93, CO2 25, BUN/Cr 38/Cr 6, calcium 8.9, AST 51, ALT 54, AP 243, albumin 3.9, prot 7.3, WBC 11.4, Hgb 11.4, Plt 176, INR 1.1 Troponin 0.94, 1.02 PH 7.38, PCO2 47, PO2 41 CT head: minimal diffuse cortical atrophy, mild chronic ischemic white matter disease, no acute intracranial abnormality CXR: cardiomegaly with vascular congestion, suspect early pulmonary edema   Past Medical History  Diagnosis Date  . ESRD (end stage renal disease) on dialysis (HCC)     started 12/2005  . Hypertension   . DM (diabetes mellitus) (HCC)   . Anemia of chronic kidney failure   . Hyperparathyroidism due to renal insufficiency (HCC)   . Closed right ankle fracture   . Ulcer of right foot (HCC)   . 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 Orif ankle fracture      right  . I& d foot ulcer      right 2007  . Cataracts      bilateral  . Av fistula placement  08/31/06    left arm - removed after conversion to AVG failed  . Arteriovenous graft placement  09/05/06    removed 09/05/06 - nonmatured  . Hemodialysis catheters  06/16/10, 10/01/12, 11/05/12    patient refuses other access than catheters     Home Meds: Prior to  Admission medications   Medication Sig Start Date End Date Taking? Authorizing Provider  atropine 1 % ophthalmic solution Place 1 drop into the right eye every 8 (eight) hours. 04/01/13   Rhetta Mura, MD  cinacalcet (SENSIPAR) 30 MG tablet Take 30 mg by mouth daily.    Historical Provider, MD  felodipine (PLENDIL) 10 MG 24 hr tablet Take 5 mg by mouth 2 (two) times daily.    Historical Provider, MD  gatifloxacin (ZYMAXID) 0.5 % SOLN Place 1 drop into the right eye every 6 (six) hours. 04/01/13   Rhetta Mura, MD  insulin NPH-regular (NOVOLIN 70/30)  (70-30) 100 UNIT/ML injection Inject 18-20 Units into the skin 2 (two) times daily with a meal. Inject 22 units in the morning and 20 units in the evening 04/06/13   Nishant Dhungel, MD  lanthanum (FOSRENOL) 1000 MG chewable tablet Chew 1,000 mg by mouth 3 (three) times daily with meals.    Historical Provider, MD  Loperamide HCl (IMODIUM PO) Take 1 tablet by mouth every 4 (four) hours as needed (diarrhea).    Historical Provider, MD  Multiple Vitamin (MULTIVITAMIN WITH MINERALS) TABS Take 1 tablet by mouth daily.    Historical Provider, MD  Nutritional Supplements (FEEDING SUPPLEMENT, NEPRO CARB STEADY,) LIQD Take 237 mLs by mouth as needed (missed meal during dialysis.). 04/06/13   Nishant Dhungel, MD  prednisoLONE acetate (PRED FORTE) 1 % ophthalmic suspension Place 1 drop into the right eye every 6 (six) hours. 04/01/13   Rhetta Mura, MD  Skin Protectants, Misc. (EUCERIN) cream Apply 1 application topically as needed for dry skin.    Historical Provider, MD  tobramycin (TOBREX) 0.3 % ophthalmic solution Place 1 drop into the right eye every 6 (six) hours. 04/01/13   Rhetta Mura, MD    Inpatient Medications:  . aspirin  325 mg Oral Daily  . cinacalcet  60 mg Oral Q breakfast  . enoxaparin (LOVENOX) injection  30 mg Subcutaneous QHS  . felodipine  5 mg Oral BID  . sodium chloride  3 mL Intravenous Q12H      Allergies:  Allergies  Allergen Reactions  . Amoxicillin Itching and Rash  . Ciprofloxacin Itching and Rash  . Daptomycin Itching and Rash  . Iron Dextran     hypotension  . Penicillins Itching and Rash  . Vancomycin Itching, Rash and Other (See Comments)    Pt states that this medications caused her "skin to peel"    Social History   Social History  . Marital Status: Married    Spouse Name: N/A  . Number of Children: N/A  . Years of Education: N/A   Occupational History  . Not on file.   Social History Main Topics  . Smoking status: Never Smoker   .  Smokeless tobacco: Not on file  . Alcohol Use: No  . Drug Use: No  . Sexual Activity: Not on file   Other Topics Concern  . Not on file   Social History Narrative     Family History  Problem Relation Age of Onset  . Alzheimer's disease Father   . Stroke Maternal Grandmother      Review of Systems: All other systems reviewed and are otherwise negative except as noted above.  Labs:  Recent Labs  12/22/15 0102 12/22/15 0451 12/22/15 0556  TROPONINI 1.69* 1.37* 1.34*   Lab Results  Component Value Date   WBC 10.2 12/22/2015   HGB 10.8* 12/22/2015   HCT 34.2* 12/22/2015   MCV 104.9*  12/22/2015   PLT PENDING 12/22/2015    Recent Labs Lab 12/22/15 0451 12/22/15 0556  NA 128* 128*  K 3.6 3.7  CL 92* 90*  CO2 23 20*  BUN 41* 41*  CREATININE 7.01* 7.18*  CALCIUM 8.2* 8.6*  PROT 6.7  --   BILITOT 0.5  --   ALKPHOS 202*  --   ALT 35  --   AST 36  --   GLUCOSE 400* 463*   Lab Results  Component Value Date   CHOL 174 04/02/2013   No results found for: DDIMER  Radiology/Studies:  No results found.  Wt Readings from Last 3 Encounters:  12/21/15 179 lb 3.7 oz (81.3 kg)  04/05/13 154 lb 5.2 oz (70 kg)    EKG: NSR 81bpm nonspecific ST-T changes - slight ST sagging in II, avF, V5-V6 with TWI in III No prior EKGs in EPIC to compare to  Physical Exam: Blood pressure 145/57, pulse 71, temperature 99.3 F (37.4 C), temperature source Oral, resp. rate 18, height 5\' 5"  (1.651 m), weight 179 lb 3.7 oz (81.3 kg), SpO2 97 %. Body mass index is 29.83 kg/(m^2). General: Well developed obese WF in no acute distress. Head: Normocephalic, sclera non-icteric, no xanthomas, nares are without discharge, blind right eye  Neck: Negative for carotid bruits. JVD not elevated. Lungs: Clear bilaterally to auscultation without wheezes, rales, or rhonchi. Breathing is unlabored. Heart: RRR with S1 S2. Soft SEM ? Radiation from fistula. No rubs or gallops appreciated. Abdomen: Soft,  non-tender, non-distended with normoactive bowel sounds. No hepatomegaly. No rebound/guarding. No obvious abdominal masses. Msk:  Strength and tone appear normal for age. Extremities: No clubbing or cyanosis. No edema.  Distal pedal pulses are 2+ and equal bilaterally. Neuro: Alert and oriented to self, January 2017, thinks its the 8th-9th, [facility]. Follows commands. Psych:  Normal affect.    Assessment and Plan   1. Altered mental status - initially occurred in the setting of hypoglycemia. Connie Christian exhibits mild residual issues with general A+O questions and seems to confabulate answers when asked why Connie Christian came to the hospital. Further workup per IM.  2. Elevated troponin - patient seen and discussed with Dr. [place]. Elevated troponin is felt nonspecific given complete lack of cardiac symptoms. Her presentation is not really consistent with classic acute coronary syndrome. Recommend consideration of further neurologic workup. In absence of chest pain, dyspnea or any other cardiac complaints, hold off IV heparin for now. Continue aspirin. Check 2D echo. Connie Christian likely needs further ischemic workup at some point given multiple cardiac risk factors but this is not felt to be urgent at this time.   3. Significant bradycardia - hard to know if respiratory event triggered bradycardia or the other way around. Connie Christian apparently bit her tongue with subsequent laceration so seizure is also a consideration. Avoid AVN blocking agents. Follow on telemetry. Check thyroid function. Keep pacer pads on patient as precaution. Agree with transfer to stepdown.  Signed, Laurann Montanaayna N Dunn PA-C 12/22/2015, 7:21 AM Pager: [phone]  Patient seen and examined and history reviewed. Agree with above findings and plan. 60 yo WF with history of ESRD, HTN, DM, and anemia [facility] with altered mental status and hypoglycemia. Noted to have mildly elevated troponin. Denies any chest pain or SOB but is unable to  give clear history. This am noted to be bradycardic on monitor. Nurse went in to find the patient cyanotic with agonal breathing and unresponsive. With jaw thrust breathing returned to normal  and patient returned to baseline. Again no cardiac symptoms. Ecg shows no acute changes. Troponin elevation persistent with relatively flat curve. Exam reveals clear lung fields. No JVD. No murmur or gallop.  I think that her troponin elevation is nonspecific and not really consistent with ACS especially with normal Ecg and no cardiac symptoms. Her bradycardic episode this am was clearly related to respiratory event with agonal breathing. Connie Christian is on no rate slowing medication. For now will check Echo and continue on ASA. Consider further neurologic evaluation. Patient to be transferred to step down. If Echo shows LV dysfunction Connie Christian will eventually need ischemic work up depending on clinical course.   Jerik Falletta [place], MDFACC 12/22/2015 8:39 AM

## 2015-12-22 NOTE — Progress Notes (Signed)
Pt waiting for bed avaialability on cardiac floor. defib pads on pt as pt may need pacing. Currently alert and oriented

## 2015-12-22 NOTE — Progress Notes (Addendum)
CRITICAL VALUE ALERT  Critical value received:  Troponin 1.69  Date of notification:  12/21/14  Time of notification:  0225  Critical value read back:Yes.    Nurse who received alert:  Hermine MessickJ. Siaosi Alter, RN   MD notified (1st page):  Kirtland BouchardK. Schorr  Time of first page:  0226  MD notified (2nd page):NA  Time of second page:NA  Responding MD:  Merdis DelayK. Schorr  Time MD responded:  [phone]

## 2015-12-22 NOTE — Progress Notes (Signed)
Pt 's mom called as requested by pt. Pt wants her mom to know she is being moved to another unit.Did not pick up phone. No voice message left as phone did not have option to leave message

## 2015-12-22 NOTE — Progress Notes (Signed)
Patient arrived to unit by bed.  Reviewed treatment plan and this RN agrees with plan.  Report received from bedside RN, French Anaracy.  Consent verified.  Patient A & o X 4.   Lung sounds clear to ausculation in all fields. No edema. Cardiac:  Regular R&R.  Removed caps and cleansed LIJ catheter with chlorhedxidine.  Aspirated ports of heparin and flushed them with saline per protocol.  Connected and secured lines, initiated treatment at 1628.  UF Goal of 1500mL and net fluid removal 1 L.  Will continue to monitor.

## 2015-12-22 NOTE — Progress Notes (Signed)
Text paged and phone paged Maren ReamerKaren Kirby on call for patient in code [facility]

## 2015-12-22 NOTE — Progress Notes (Signed)
Report given to [facility] RN French Anaracy. Pt will be moved to bed 07

## 2015-12-22 NOTE — Progress Notes (Addendum)
Paged to pt's room for CODE BLUE called by RN. NP to bedside. Pt never lost pulse or respirations and no ACLS was performed. Per RN, pt brady'd down to the 30s and went unresponsive. Code called due to this. Pt bit her tongue during event, but no obvious seizure activity. Was slightly post ictal after event, but recovered quickly to alert and oriented status. As the pt [facility], all records were reviewed.  S: Pt says she is back to normal. Denies hx of seizures. She denies any CP today, prior to this event, or in the days prior to admit. Nausea at home. No jaw or arm pain. No epigastric discomfort or heartburn over past few days. Never had a heart attack before.  Never been told she had sleep apnea and does not sleep with CPAP at home. When asked about tonight's event, she denies any prodromal symptoms of dizziness, numbness, HA or presyncope.  O: Chronically ill appearing elderly WF who appears older than her stated age. At present, she appears well and is in NAD. Alert and oriented. Appropriate in behavior and conversation. VS reviewed. On 4L [place]. Card: RRR. Lungs: CTA. Normal respiratory effort. 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 MOE x 4. Follows commands. Tongue is midline and no laceration noted. Strength is 5/5 throughout. Face/smile is symmetrical. Speech is clear. Mood is normal.  A/P: 1. Syncope with tongue biting and post ictal condition-back to baseline now. ? Seizure. Will need neuro consult this am-? MRI, EEG. [facility] was normal except for chronic changes of atrophy, etc. No prior hx of seizures.  2. Bradycardic episode with syncope-HR normal now. ? Cardiac event. ? OSA related as pt's body/neck is favorable for sleep apnea even though she denies. No arrhythmia noted tonight. VS were stable prior to event.  3. Hypoglycemia-this was her presenting [facility] with sugar of 23. D10 was started and sugars have been in the mid to high  300s and D10 stopped.  4. NSTEMI-this was charted by ED [facility] at [facility] and troponins there were elevated. This NP reviewed chart and do not see where any anticoagulation was given. Pt denies and has not had any CP. EKG now looks better than previous one at [facility] and troponins have trended down now. This NP called PA for cardiology this am ([place]) and discussed pt. Per cards recs-Given CT head neg, will give ASA 325mg  now and await cardiology's recommendations on further anticoagulation.  5. ESRD-outpt in [place]. I believe she had HD on 12/21/15.  Pt being transferred to SDU. Labs ordered.  All of this verbally reported to 0700 attending.  Jimmye NormanKaren Kirby-Graham, NP [facility]

## 2015-12-22 NOTE — Progress Notes (Signed)
This pt checked on pt at 0540; pt cyanotic, agonal breathing, and unresponsive. Code blue was called. Checked cbg=396. Dr. Julian ReilGardner was on the unit and responded before code team arrives. Pt's O2 sat was checked and it was 92% on 15L. Eventually pt become responsive.

## 2015-12-22 NOTE — Progress Notes (Signed)
PROGRESS NOTE  Connie Christian ZOX:[id number] DOB: 1956-03-24 DOA: 12/21/2015 PCP: No primary care provider on file.  Assessment/Plan: Hypoglycemia The patient used her regular dose of insulin this morning, but has eaten very little today. Resume diet Resume 70/30 at a lower dose Continue CBG monitoring.   Elevated troponin  Nonspecific abnormal electrocardiogram (ECG) (EKG) The patient denies any chest pain, dyspnea, dizziness, diaphoresis or palpitations  echocardiogram Appreciate cards consult   ESRD on dialysis [facility]) Continue hemodialysis as per schedule. T/Th/SAt Notified Dr. Arlean Hopping   HTN (hypertension) Continue Plendil 10 mg by mouth daily. Monitor blood pressure.   Anemia of renal disease Monitor hematocrit and hemoglobin. Erythropoietin supplementation per nephrology.   ?OSA -monitor in SDU -outpatient sleep study -does snore  Acute resp failure -wean o2 -x ray does not show pulm edema  Code Status: full Family Communication: patient Disposition Plan:    Consultants:  Cards  renal  Procedures:      HPI/Subjective: Asking to eat  Objective: Filed Vitals:   12/22/15 0604 12/22/15 0612  BP: 167/70 153/78  Pulse: 87 83  Temp:    Resp:     No intake or output data in the 24 hours ending 12/22/15 0830 Filed Weights   12/21/15 2046  Weight: 81.3 kg (179 lb 3.7 oz)    Exam:   General:  Awake- answers questions  Cardiovascular: rrr  Respiratory: clear  Abdomen: +BS, soft  Musculoskeletal: no edema- chronic skin changes   Data Reviewed: Basic Metabolic Panel:  Recent Labs Lab 12/22/15 0102 12/22/15 0451 12/22/15 0556  NA  --  128* 128*  K  --  3.6 3.7  CL  --  92* 90*  CO2  --  23 20*  GLUCOSE  --  400* 463*  BUN  --  41* 41*  CREATININE 6.90* 7.01* 7.18*  CALCIUM  --  8.2* 8.6*  MG  --   --  2.4   Liver Function Tests:  Recent Labs Lab 12/22/15 0451  AST 36  ALT 35  ALKPHOS 202*  BILITOT 0.5    PROT 6.7  ALBUMIN 3.0*   No results for input(s): LIPASE, AMYLASE in the last 168 hours. No results for input(s): AMMONIA in the last 168 hours. CBC:  Recent Labs Lab 12/22/15 0451  WBC 10.2  NEUTROABS 8.2*  HGB 10.8*  HCT 34.2*  MCV 104.9*  PLT 185   Cardiac Enzymes:  Recent Labs Lab 12/22/15 0102 12/22/15 0451 12/22/15 0556  TROPONINI 1.69* 1.37* 1.34*   BNP (last 3 results) No results for input(s): BNP in the last 8760 hours.  ProBNP (last 3 results) No results for input(s): PROBNP in the last 8760 hours.  CBG:  Recent Labs Lab 12/21/15 2158 12/22/15 0123 12/22/15 0549 12/22/15 0755  GLUCAP 116* 300* 396* 429*    No results found for this or any previous visit (from the past 240 hour(s)).   Studies: Dg Chest Port 1 View  12/22/2015  CLINICAL DATA:  Chest pain, cough, shortness of breath. Dialysis dependent renal failure. EXAM: PORTABLE CHEST 1 VIEW COMPARISON:  PA and lateral chest x-ray of December 21, 2015 FINDINGS: The lungs are adequately inflated. There is no focal infiltrate the retrocardiac regions are clear today. There is no pleural effusion. The cardiac silhouette is mildly enlarged. The pulmonary vascularity is not engorged. The mediastinum is mildly prominent in width but stable. The dual-lumen dialysis type catheter tip projects over the midportion of the SVC. IMPRESSION: There is no evidence of pneumonia  nor pulmonary edema. Stable mild cardiomegaly. The dialysis catheter is in reasonable position. Electronically Signed   By: David  SwazilandJordan [name]   On: 12/22/2015 07:43    Scheduled Meds: . aspirin  325 mg Oral Daily  . cinacalcet  60 mg Oral Q breakfast  . enoxaparin (LOVENOX) injection  30 mg Subcutaneous QHS  . felodipine  5 mg Oral BID  . insulin aspart  0-9 Units Subcutaneous 6 times per day  . sodium chloride  3 mL Intravenous Q12H   Continuous Infusions:  Antibiotics Given (last 72 hours)    None      Principal Problem:    Hypoglycemia Active Problems:   ESRD on dialysis (HCC)   HTN (hypertension)   Anemia of renal disease   Elevated troponin   Nonspecific abnormal electrocardiogram (ECG) (EKG)   Bradycardia    Time spent: 25 min    Giavana Rooke [facility]Treasure Ochs  [facility] Pager [phone]. If 7PM-7AM, please contact night-coverage at [email or web address], password [email or web address] 12/22/2015, 8:30 AM  LOS: 1 day

## 2015-12-22 NOTE — Code Documentation (Signed)
CODE BLUE NOTE  Patient Name: Connie CoryShirley M Kloss   MRN: [id number]   Date of Birth/ Sex: 04/07/1956 , female      Admission Date: 12/21/2015  Attending Provider: Bobette Moavid Manuel Ortiz, MD  Primary Diagnosis: Hypoglycemia    Indication: Pt was in her usual state of health until this AM, when she was noted to ?post ictal. Code blue was subsequently called. At the time of arrival on scene, patient was hemodynamically stable but unconscious.    Technical Description:  - CPR performance duration:  No CPR performed        - Was patient intubated pre/post CPR? No    Medications Administered: Y = Yes; Blank = No Amiodarone    Atropine    Calcium    Epinephrine    Lidocaine    Magnesium    Norepinephrine    Phenylephrine    Sodium bicarbonate    Vasopressin      Post CPR evaluation:  - Final Status - Was patient successfully resuscitated ? Yes - What is current rhythm? NSR - What is current hemodynamic status? stable   Miscellaneous Information:  - Labs sent, including: Bmet, ABG, troponin, Mg, CXR, EKG all stat  - Primary team notified?  Yes  - Family Notified? No  - Additional notes/ transfer status: Transferred from [facility] due to concern for NSTEMI. Trop elevated to 1.67 here 5 hours ago, then down to 1.37 an hour ago. (0.97 at [facility]).    Almon Herculesaye T Gonfa, MD  12/22/2015, 6:15 AM

## 2015-12-22 NOTE — Progress Notes (Signed)
3rd page sent to MD regarding elevated blood sugar. Still awaiting response

## 2015-12-22 NOTE — Progress Notes (Signed)
Dialysis treatment completed.  1500 mL ultrafiltrated.  1000 mL net fluid removal.  Patient status unchanged. Lung sounds clear to ausculation in all fields. No edema. Cardiac: Regular R&R.  Cleansed LIJ catheter with chlorhexidine.  Disconnected lines and flushed ports with saline per protocol.  Ports locked with heparin and capped per protocol.    Report given to bedside, RN Nedra HaiLee.

## 2015-12-22 NOTE — Consult Note (Signed)
[facility] Renal Consultation Note    Indication for Consultation:  Management of ESRD/hemodialysis; anemia, hypertension/volume and secondary hyperparathyroidism  HPI: Connie Christian is a 60 y.o. female with ESRD on TTS dialysis in [place] with HTN, DM who presented from [facility] where she was taken from home on 1/10.Marland Kitchen  Her last HD was 1/9 (she ran Monday instead of Saturday due to bad weather. She ran for 3 hr with a net UF of 2.6 and left 1 kg above her EDW.  Her post HD temp was 98.9.  She missed her Tuesday and was taken from home (per her mother) to [facility] where she was found to be profoundly hypoglycemic at 23. She tells me she has had N, V, D but has that at times.  Makes very little urine. She is a very poor historian regarding the sequence of events.  Her biggest complaint seems to be weakness and she is wanting something to eat. No overt fever, chills, SOB. She does have a dry cough at times. No pain anywhere. Her mother states she has been "feeling poorly" the last little bit. EDW was recently raised 1 kg due to BP drops on HD  Past Medical History  Diagnosis Date  . ESRD (end stage renal disease) on dialysis (HCC)     started 12/2005  . Hypertension   . DM (diabetes mellitus) (HCC)   . Anemia of chronic kidney failure   . Hyperparathyroidism due to renal insufficiency (HCC)   . Closed right ankle fracture   . Ulcer of right foot (HCC)   . 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 Orif ankle fracture      right  . I& d foot ulcer      right 2007  . Cataracts      bilateral  . Av fistula placement  08/31/06    left arm - removed after conversion to AVG failed  . Arteriovenous graft placement  09/05/06    removed 09/05/06 - nonmatured  . Hemodialysis catheters  06/16/10, 10/01/12, 11/05/12    patient refuses other access than catheters   Family History  Problem Relation Age of Onset  .  Alzheimer's disease Father   . Stroke Maternal Grandmother    Social History:  reports that she has never smoked. She does not have any smokeless tobacco history on file. She reports that she does not drink alcohol or use illicit drugs. Allergies  Allergen Reactions  . Amoxicillin Itching and Rash  . Ciprofloxacin Itching and Rash  . Daptomycin Itching and Rash  . Iron Dextran     hypotension  . Penicillins Itching and Rash  . Vancomycin Itching, Rash and Other (See Comments)    Pt states that this medications caused her "skin to peel"   Prior to Admission medications   Medication Sig Start Date End Date Taking? Authorizing Provider  atropine 1 % ophthalmic solution Place 1 drop into the right eye every 8 (eight) hours. 04/01/13   Rhetta Mura, MD  cinacalcet (SENSIPAR) 30 MG tablet Take 30 mg by mouth daily.    Historical Provider, MD  felodipine (PLENDIL) 10 MG 24 hr tablet Take 5 mg by mouth 2 (two) times daily.    Historical Provider, MD  gatifloxacin (ZYMAXID) 0.5 % SOLN Place 1 drop into the right eye every 6 (six) hours. 04/01/13   Rhetta Mura, MD  insulin NPH-regular (NOVOLIN 70/30) (70-30) 100 UNIT/ML  injection Inject 18-20 Units into the skin 2 (two) times daily with a meal. Inject 22 units in the morning and 20 units in the evening 04/06/13   Nishant Dhungel, MD  lanthanum (FOSRENOL) 1000 MG chewable tablet Chew 1,000 mg by mouth 3 (three) times daily with meals.    Historical Provider, MD  Loperamide HCl (IMODIUM PO) Take 1 tablet by mouth every 4 (four) hours as needed (diarrhea).    Historical Provider, MD  Multiple Vitamin (MULTIVITAMIN WITH MINERALS) TABS Take 1 tablet by mouth daily.    Historical Provider, MD  Nutritional Supplements (FEEDING SUPPLEMENT, NEPRO CARB STEADY,) LIQD Take 237 mLs by mouth as needed (missed meal during dialysis.). 04/06/13   Nishant Dhungel, MD  prednisoLONE acetate (PRED FORTE) 1 % ophthalmic suspension Place 1 drop into the right  eye every 6 (six) hours. 04/01/13   Rhetta Mura, MD  Skin Protectants, Misc. (EUCERIN) cream Apply 1 application topically as needed for dry skin.    Historical Provider, MD  tobramycin (TOBREX) 0.3 % ophthalmic solution Place 1 drop into the right eye every 6 (six) hours. 04/01/13   Rhetta Mura, MD   Current Facility-Administered Medications  Medication Dose Route Frequency Provider Last Rate Last Dose  . aspirin tablet 325 mg  325 mg Oral Daily Leda Gauze, NP   325 mg at 12/22/15 0705  . [START ON 12/23/2015] calcitRIOL (ROCALTROL) capsule 0.25 mcg  0.25 mcg Oral Q T,Th,Sa-HD Weston Settle, PA-C      . cinacalcet [facility]) tablet 60 mg  60 mg Oral Q breakfast Bobette Mo, MD   60 mg at 12/22/15 0747  . [START ON 12/23/2015] Darbepoetin Alfa (ARANESP) injection 60 mcg  60 mcg Intravenous Q Thu-HD Weston Settle, PA-C      . enoxaparin (LOVENOX) injection 30 mg  30 mg Subcutaneous QHS Bobette Mo, MD   30 mg at 12/21/15 2321  . felodipine (PLENDIL) 24 hr tablet 5 mg  5 mg Oral BID Bobette Mo, MD   5 mg at 12/22/15 0024  . insulin aspart (novoLOG) injection 0-9 Units  0-9 Units Subcutaneous TID WC Jessica U Vann, DO      . insulin aspart protamine- aspart (NOVOLOG MIX 70/30) injection 10 Units  10 Units Subcutaneous BID WC Joseph Art, DO      . multivitamin (RENA-VIT) tablet 1 tablet  1 tablet Oral QHS Weston Settle, PA-C      . ondansetron [facility]) tablet 4 mg  4 mg Oral Q6H PRN Bobette Mo, MD       Or  . ondansetron [facility]) injection 4 mg  4 mg Intravenous Q6H PRN Bobette Mo, MD      . sodium chloride 0.9 % injection 3 mL  3 mL Intravenous Q12H Bobette Mo, MD   3 mL at 12/21/15 2321   Labs: Basic Metabolic Panel:  Recent Labs Lab 12/22/15 0102 12/22/15 0451 12/22/15 0556  NA  --  128* 128*  K  --  3.6 3.7  CL  --  92* 90*  CO2  --  23 20*  GLUCOSE  --  400* 463*  BUN  --  41* 41*  CREATININE 6.90* 7.01*  7.18*  CALCIUM  --  8.2* 8.6*   Liver Function Tests:  Recent Labs Lab 12/22/15 0451  AST 36  ALT 35  ALKPHOS 202*  BILITOT 0.5  PROT 6.7  ALBUMIN 3.0*  CBC:  Recent Labs Lab 12/22/15 0451  WBC 10.2  NEUTROABS 8.2*  HGB 10.8*  HCT 34.2*  MCV 104.9*  PLT 185   Cardiac Enzymes:  Recent Labs Lab 12/22/15 0102 12/22/15 0451 12/22/15 0556 12/22/15 1221  TROPONINI 1.69* 1.37* 1.34* 1.19*   CBG:  Recent Labs Lab 12/22/15 0123 12/22/15 0549 12/22/15 0755 12/22/15 0935 12/22/15 1113  GLUCAP 300* 396* 429* 426* 234*   Studies/Results: Dg Chest Port 1 View  12/22/2015  CLINICAL DATA:  Chest pain, cough, shortness of breath. Dialysis dependent renal failure. EXAM: PORTABLE CHEST 1 VIEW COMPARISON:  PA and lateral chest x-ray of December 21, 2015 FINDINGS: The lungs are adequately inflated. There is no focal infiltrate the retrocardiac regions are clear today. There is no pleural effusion. The cardiac silhouette is mildly enlarged. The pulmonary vascularity is not engorged. The mediastinum is mildly prominent in width but stable. The dual-lumen dialysis type catheter tip projects over the midportion of the SVC. IMPRESSION: There is no evidence of pneumonia nor pulmonary edema. Stable mild cardiomegaly. The dialysis catheter is in reasonable position. Electronically Signed   By: David  Swaziland [name]   On: 12/22/2015 07:43    ROS: As per HPI otherwise negative.  Physical Exam: Filed Vitals:   12/22/15 0612 12/22/15 0925 12/22/15 1110 12/22/15 1447  BP: 153/78 162/74 164/67   Pulse: 83 76 81   Temp:   99.8 F (37.7 C) 98.5 F (36.9 C)  TempSrc:   Oral Oral  Resp:  16 23   Height:  5\' 5"  (1.651 m)    Weight:  82.1 kg (181 lb)    SpO2: 100% 99% 96%      General: pale WF appears older than age Head: Normocephalic, atraumatic, right eye blind; left eye sclera injected with redness around eye Neck: Supple. JVD not elevated. Lungs: Clear bilaterally to auscultation  without wheezes, rales, or rhonchi. Breathing is unlabored. Heart: RRR with S1 S2.  Abdomen: Soft, obese non-tender, non-distended with normoactive bowel sounds. No rebound/guarding. Lower extremities:without overt edema on the right; slight ankle edema on left  Neuro: Alert and oriented X 3. Moves all extremities spontaneously. Psych:  Responds to questions appropriately with a normal affect- more engaging than usual Dialysis Access: right IJ - cath exit clean no evidence of infection  Dialysis Orders: [facility] 3.75 hr 160 400/A 1.5 2 K 2 Ca profile 4 heparin 2000 Mircera 75 q 4 weeks last dose 12/20 calcitriiol 0.25 Recent labs:  Hgb 10.6 stable 41% sat Ca 10.2 corr P 6 only takes tums - can't tolerate other binders  iPTH 305  Assessment/Plan: 1. Hypoglycemia BS come up and then dropping back down - BS 48 at present - given IV dextrose - starting on diet should help 2. ESRD -  TTS - off schedule - missed Sat due to weather, came Monday and missed Tuesday - plan HD today and then again tomorrow for short session to get back on schedule 3. Hypertension/volume  - BP up some - no excess volume on HD - up some but not greatly 4. Anemia  - Hgb 10.8 -ESA due for redose 1/17; add tums 3 ac 5. Metabolic bone disease -  Continue low dose calcitriol/sensipar/add binders 6. Nutrition - diet started - add nepro 7. Febrile illness- may be contributory to hypoglycemia plus limited intake today; high risk for catheter sepsis; she has vehemently opposed vascular access in the past. Temp now up to 99.8 - will draw BC on HD and given empiric antibiotics. - unfortunately has Vanc allergy - start with  Ancef   Sheffield SliderMartha B Bergman, PA-C [facility] Beeper [phone] 12/22/2015, 2:48 PM   Pt seen, examined and agree w A/P as above. Chronically ill appearing diabetic/ESRD here with hypoglycemic episode. Has not been eating or feeling well lately per pts mother. Pt poor historian.  Low grade temp, negative  CXR, dry cough.  Tunneled HD cath no gross infection. Plan HD today w abx and blood cx's.  Will follow.  Vinson Moselleob Mollee Neer MD [facility] pager [phone]    cell [phone] 12/22/2015, 3:53 PM

## 2015-12-22 NOTE — Progress Notes (Signed)
Received a verbal order to cover blood sugar with maximum coverage on sliding scale. 9 units administered before pt left the floor

## 2015-12-22 NOTE — Progress Notes (Signed)
MD paged again for insulin orders for cbg of 429. Awaiting orders

## 2015-12-23 ENCOUNTER — Observation Stay (HOSPITAL_COMMUNITY): Payer: Medicare Other

## 2015-12-23 ENCOUNTER — Observation Stay (HOSPITAL_BASED_OUTPATIENT_CLINIC_OR_DEPARTMENT_OTHER): Payer: Medicare Other

## 2015-12-23 DIAGNOSIS — R7989 Other specified abnormal findings of blood chemistry: Secondary | ICD-10-CM | POA: Diagnosis not present

## 2015-12-23 DIAGNOSIS — E1122 Type 2 diabetes mellitus with diabetic chronic kidney disease: Secondary | ICD-10-CM | POA: Diagnosis present

## 2015-12-23 DIAGNOSIS — G4733 Obstructive sleep apnea (adult) (pediatric): Secondary | ICD-10-CM | POA: Diagnosis present

## 2015-12-23 DIAGNOSIS — Z880 Allergy status to penicillin: Secondary | ICD-10-CM | POA: Diagnosis not present

## 2015-12-23 DIAGNOSIS — Z992 Dependence on renal dialysis: Secondary | ICD-10-CM | POA: Diagnosis not present

## 2015-12-23 DIAGNOSIS — R001 Bradycardia, unspecified: Secondary | ICD-10-CM | POA: Diagnosis present

## 2015-12-23 DIAGNOSIS — E11319 Type 2 diabetes mellitus with unspecified diabetic retinopathy without macular edema: Secondary | ICD-10-CM | POA: Diagnosis present

## 2015-12-23 DIAGNOSIS — Z881 Allergy status to other antibiotic agents status: Secondary | ICD-10-CM | POA: Diagnosis not present

## 2015-12-23 DIAGNOSIS — D631 Anemia in chronic kidney disease: Secondary | ICD-10-CM | POA: Diagnosis not present

## 2015-12-23 DIAGNOSIS — N186 End stage renal disease: Secondary | ICD-10-CM | POA: Diagnosis present

## 2015-12-23 DIAGNOSIS — H5441 Blindness, right eye, normal vision left eye: Secondary | ICD-10-CM | POA: Diagnosis present

## 2015-12-23 DIAGNOSIS — I120 Hypertensive chronic kidney disease with stage 5 chronic kidney disease or end stage renal disease: Secondary | ICD-10-CM | POA: Diagnosis present

## 2015-12-23 DIAGNOSIS — E11649 Type 2 diabetes mellitus with hypoglycemia without coma: Secondary | ICD-10-CM | POA: Diagnosis present

## 2015-12-23 DIAGNOSIS — J9601 Acute respiratory failure with hypoxia: Secondary | ICD-10-CM | POA: Diagnosis present

## 2015-12-23 DIAGNOSIS — Z888 Allergy status to other drugs, medicaments and biological substances status: Secondary | ICD-10-CM | POA: Diagnosis not present

## 2015-12-23 DIAGNOSIS — E162 Hypoglycemia, unspecified: Secondary | ICD-10-CM | POA: Diagnosis present

## 2015-12-23 DIAGNOSIS — Z794 Long term (current) use of insulin: Secondary | ICD-10-CM | POA: Diagnosis not present

## 2015-12-23 DIAGNOSIS — I517 Cardiomegaly: Secondary | ICD-10-CM | POA: Diagnosis present

## 2015-12-23 DIAGNOSIS — R4182 Altered mental status, unspecified: Secondary | ICD-10-CM | POA: Diagnosis present

## 2015-12-23 LAB — RENAL FUNCTION PANEL
ANION GAP: 13 (ref 5–15)
Albumin: 2.6 g/dL — ABNORMAL LOW (ref 3.5–5.0)
BUN: 38 mg/dL — AB (ref 6–20)
CHLORIDE: 94 mmol/L — AB (ref 101–111)
CO2: 27 mmol/L (ref 22–32)
Calcium: 8.1 mg/dL — ABNORMAL LOW (ref 8.9–10.3)
Creatinine, Ser: 6.07 mg/dL — ABNORMAL HIGH (ref 0.44–1.00)
GFR calc Af Amer: 8 mL/min — ABNORMAL LOW (ref >60)
GFR calc non Af Amer: 7 mL/min — ABNORMAL LOW (ref >60)
GLUCOSE: 297 mg/dL — AB (ref 65–99)
POTASSIUM: 4 mmol/L (ref 3.5–5.1)
Phosphorus: 5 mg/dL — ABNORMAL HIGH (ref 2.5–4.6)
Sodium: 134 mmol/L — ABNORMAL LOW (ref 135–145)

## 2015-12-23 LAB — GLUCOSE, CAPILLARY
GLUCOSE-CAPILLARY: 241 mg/dL — AB (ref 65–99)
GLUCOSE-CAPILLARY: 175 mg/dL — AB (ref 65–99)
Glucose-Capillary: 122 mg/dL — ABNORMAL HIGH (ref 65–99)
Glucose-Capillary: 339 mg/dL — ABNORMAL HIGH (ref 65–99)
Glucose-Capillary: 353 mg/dL — ABNORMAL HIGH (ref 65–99)
Glucose-Capillary: 162 mg/dL — ABNORMAL HIGH (ref 65–99)

## 2015-12-23 LAB — CBC
HCT: 33.1 % — ABNORMAL LOW (ref 36.0–46.0)
HEMOGLOBIN: 10.2 g/dL — AB (ref 12.0–15.0)
MCH: 32.4 pg (ref 26.0–34.0)
MCHC: 30.8 g/dL (ref 30.0–36.0)
MCV: 105.1 fL — AB (ref 78.0–100.0)
PLATELETS: 189 10*3/uL (ref 150–400)
RBC: 3.15 MIL/uL — AB (ref 3.87–5.11)
RDW: 15.3 % (ref 11.5–15.5)
WBC: 9.1 10*3/uL (ref 4.0–10.5)

## 2015-12-23 MED ORDER — LORAZEPAM 2 MG/ML IJ SOLN: 0.5000 mg | Freq: Once | INTRAMUSCULAR | Status: AC

## 2015-12-23 MED ORDER — LORAZEPAM 2 MG/ML IJ SOLN
INTRAMUSCULAR | Status: AC
  Administered 2015-12-23: 0.5 mg
  Filled 2015-12-23: qty 1

## 2015-12-23 MED ORDER — INSULIN ASPART PROT & ASPART (70-30 MIX) 100 UNIT/ML ~~LOC~~ SUSP
12.0000 [IU] | Freq: Two times a day (BID) | SUBCUTANEOUS | Status: DC
  Administered 2015-12-24 – 2015-12-25 (×4): 12 [IU] via SUBCUTANEOUS
  Filled 2015-12-23: qty 10

## 2015-12-23 MED ORDER — INSULIN ASPART PROT & ASPART (70-30 MIX) 100 UNIT/ML ~~LOC~~ SUSP
15.0000 [IU] | Freq: Two times a day (BID) | SUBCUTANEOUS | Status: DC
  Filled 2015-12-23: qty 10

## 2015-12-23 MED ORDER — CETYLPYRIDINIUM CHLORIDE 0.05 % MT LIQD
7.0000 mL | Freq: Two times a day (BID) | OROMUCOSAL | Status: DC
  Administered 2015-12-23 – 2015-12-24 (×4): 7 mL via OROMUCOSAL

## 2015-12-23 NOTE — Progress Notes (Signed)
At approx. [phone] Paged Eula ListenK. Kirby-Graham of [facility] regarding pt's severe periods of apparent sleep apnea with SpO2 in 20s-30s, unresponsive periods, and cyanosis in lips and fingertips alternating with periods or Agitation/Completed Oriented and SpO2 90s on Ventimask @55 %. Order received for CPAP.   @0507  Ativan order received to relax pt while on CPAP. Administered @0515  and desired effect obtained. Pt's mother at bedside and updated on plan of care.

## 2015-12-23 NOTE — Progress Notes (Signed)
Inpatient Diabetes Program Recommendations  AACE/ADA: New Consensus Statement on Inpatient Glycemic Control (2015)  Target Ranges:  Prepandial:   less than 140 mg/dL      Peak postprandial:   less than 180 mg/dL (1-2 hours)      Critically ill patients:  140 - 180 mg/dL   Noted 13/0870/30 still not given this am. Hopefully patient will receive scheduled 70/30 this evening. Will watch. SW  Thanks,  Christena DeemShannon Miner Koral RN, MSN, [facility] Inpatient Diabetes Coordinator Team Pager [phone] (8a-5p)

## 2015-12-23 NOTE — Progress Notes (Signed)
  [facility] Progress Note   Subjective: no complaitns. Had problems with sleep apnea last night.   Filed Vitals:   12/23/15 0454 12/23/15 0852 12/23/15 1053 12/23/15 1342  BP: 134/61 129/86  164/59  Pulse: 74 81  73  Temp:   98.8 F (37.1 C)   TempSrc:   Oral   Resp: 21 16  22   Height:      Weight:      SpO2: 95% 91%  98%    Inpatient medications: . antiseptic oral rinse  7 mL Mouth Rinse BID  . aspirin  325 mg Oral Daily  . calcitRIOL  0.25 mcg Oral Q T,Th,Sa-HD  . calcium carbonate  600 mg of elemental calcium Oral TID WC  .  ceFAZolin (ANCEF) IV  1 g Intravenous Q24H  . cinacalcet  60 mg Oral Q breakfast  . enoxaparin (LOVENOX) injection  30 mg Subcutaneous QHS  . felodipine  5 mg Oral BID  . insulin aspart  0-9 Units Subcutaneous TID WC  . insulin aspart protamine- aspart  12 Units Subcutaneous BID WC  . multivitamin  1 tablet Oral QHS  . sodium chloride  3 mL Intravenous Q12H     ondansetron **OR** ondansetron (ZOFRAN) IV  Exam: Alert no distress chron ill No jvd R cornea opaque Chest clear bilat RRR 2/6 SEM no rg Abd soft ntnd no ascites Ext no LE or UE edema Left IJ cath, clean exit site Neuro alert, ox 3   [facility]  3h 45min  F160  2/2 bath  P4  Hep 2000  L IJ cath Mircera 75 q 4wks last 12/20 Calc 0.25 ug pth 305 tfs 41%      Assessment: 1 Hypoglycemia 2 Sleep apnea - recent onset per family, snoring "real loud" for last week or so and harder to wake up 3 ESRD  4 HTN 5 MBD stable 6 Anemia stable 7 Low grade fever - blood cx's neg, doubt cath sepsis  Plan - HD today to get back on schedule   Vinson Moselleob Jaedyn Lard MD [facility] pager [phone]    cell [phone] 12/23/2015, 2:01 PM    Recent Labs Lab 12/22/15 0102 12/22/15 0451 12/22/15 0556  NA  --  128* 128*  K  --  3.6 3.7  CL  --  92* 90*  CO2  --  23 20*  GLUCOSE  --  400* 463*  BUN  --  41* 41*  CREATININE 6.90* 7.01* 7.18*  CALCIUM  --  8.2* 8.6*     Recent Labs Lab 12/22/15 0451  AST 36  ALT 35  ALKPHOS 202*  BILITOT 0.5  PROT 6.7  ALBUMIN 3.0*    Recent Labs Lab 12/22/15 0451  WBC 10.2  NEUTROABS 8.2*  HGB 10.8*  HCT 34.2*  MCV 104.9*  PLT 185

## 2015-12-23 NOTE — Progress Notes (Signed)
*  PRELIMINARY RESULTS* Echocardiogram 2D Echocardiogram has been performed.  Jeryl Columbialliott, Aisea Bouldin 12/23/2015, 1:52 PM

## 2015-12-23 NOTE — Progress Notes (Signed)
Echo reviewed : this shows normal LV function. Mild aortic stenosis. Nothing further to add from a cardiac standpoint. Please call with questions.  Mario Voong Sw[place]il[place]Jordan MD, [facility]

## 2015-12-23 NOTE — Care Management Note (Addendum)
Case Management Note  Patient Details  Name: Connie Christian MRN: [id number] Date of Birth: 1956-06-17  Subjective/Objective:                 Admitted with  medical history of ESRD on hemodialysis, type 2 diabetes, hypertension, diabetic retinopathy (patient is legally blind), anemia of renal disease who comes referred from [facility] after being taken there via EMS due to altered status from her dialysis center. Her CBG was 23 mg/dL. Resides with mom. Independent with ADL's pta. DME: walker.   Action/Plan:  Return to home when medically stable. CM to f/u with d/c needs. Expected Discharge Date:                  Expected Discharge Plan:  Home/Self Care  In-House Referral:     Discharge planning Services  CM Consult  Post Acute Care Choice:    Choice offered to:     DME Arranged:    DME Agency:     HH Arranged:    HH Agency:     Status of Service:  In process, will continue to follow  Medicare Important Message Given:    Date Medicare IM Given:    Medicare IM give by:    Date Additional Medicare IM Given:    Additional Medicare Important Message give by:     If discussed at Long Length of Stay Meetings, dates discussed:    Additional Comments: CM spoke with mom @ bedside, pt asleep, regarding d/c planning. Mom states she  will be with pt  24/7 once d/c. CM received consult: Needs Cpap at home and sleep study arranged. CM faxed completed order requested form for [facility] to establish an appointment. Referral made with Jermaine([facility]) @ [phone] for  home CPAP.     Gae GallopCole, Connie Christian, ArizonaRN,BSN,CM   [phone]   12/23/2015, 10:06 AM

## 2015-12-23 NOTE — Progress Notes (Signed)
Inpatient Diabetes Program Recommendations  AACE/ADA: New Consensus Statement on Inpatient Glycemic Control (2015)  Target Ranges:  Prepandial:   less than 140 mg/dL      Peak postprandial:   less than 180 mg/dL (1-2 hours)      Critically ill patients:  140 - 180 mg/dL   Review of Glycemic Control  Results for Nunzio CoryBEANE, Lyna M (MRN [id number]) as of 12/23/2015 09:19  Ref. Range 12/22/2015 15:41 12/22/2015 19:13 12/22/2015 21:20 12/23/2015 03:46 12/23/2015 07:26  Glucose-Capillary Latest Ref Range: 65-99 mg/dL 90 952122 (H) 841181 (H) 324339 (H) 353 (H)    Note patient ordered 70/30 12 units BID yesterday, however dose was not to start until this am. Glucose in the mid 300's. 70/30 insulin to be given this am. No recs today, unsure of how the patient will respond to the 12 units this am. Will follow while inpatient.  Thanks,  Christena DeemShannon Willam Munford RN, MSN, [facility] Inpatient Diabetes Coordinator Team Pager [phone] (8a-5p)

## 2015-12-23 NOTE — Progress Notes (Signed)
RT Called to room due to new CPAP order for patient that is O2 Sats are dropping once patient falls to sleep.  RT set up CPAP of 8cmH2O with 6 L of O2 bleed in.  Patient is tolerating fairly well.  Pt O2 Sats are 95% on CPAP.  RT will continue to monitor.  RN aware

## 2015-12-23 NOTE — Progress Notes (Addendum)
PROGRESS NOTE  Connie Christian Connie Christian ZOX:[id number] DOB: 1956/09/21 DOA: 12/21/2015 PCP: No primary care provider on file.  Assessment/Plan: Hypoglycemia The patient used her regular dose of insulin this morning, but ate very little  Resume diet Resume 70/30 at a lower dose Continue CBG monitoring.   Elevated troponin  Nonspecific abnormal electrocardiogram (ECG) (EKG) The patient denies any chest pain, dyspnea, dizziness, diaphoresis or palpitations  echocardiogram Appreciate cards consult   ESRD on dialysis [facility](HCC) Continue hemodialysis as per schedule. T/Th/SAt Notified Dr. Arlean Hoppingschertz  Fever <100 -blood cultures ordered   HTN (hypertension) Continue Plendil 10 mg by mouth daily. Monitor blood pressure.   Anemia of renal disease Monitor hematocrit and hemoglobin. Erythropoietin supplementation per nephrology.   ?OSA -had another episode  Last PM -palced on Cpap  Code Status: full Family Communication: patient Disposition Plan:    Consultants:  Cards  renal  Procedures:      HPI/Subjective: Sleeping this AM  Objective: Filed Vitals:   12/23/15 0420 12/23/15 0454  BP:  134/61  Pulse: 76 74  Temp:    Resp: 24 21    Intake/Output Summary (Last 24 hours) at 12/23/15 0805 Last data filed at 12/23/15 0111  Gross per 24 hour  Intake     90 ml  Output   1000 ml  Net   -910 ml   Filed Weights   12/22/15 1602 12/22/15 2012 12/23/15 0345  Weight: 81.7 kg (180 lb 1.9 oz) 80.7 kg (177 lb 14.6 oz) 81.6 kg (179 lb 14.3 oz)    Exam:   General:  Sleeping, blind  Cardiovascular: rrr  Respiratory: no wheezing  Abdomen: +BS, soft  Musculoskeletal: no edema- chronic skin changes LE  Data Reviewed: Basic Metabolic Panel:  Recent Labs Lab 12/22/15 0102 12/22/15 0451 12/22/15 0556  NA  --  128* 128*  K  --  3.6 3.7  CL  --  92* 90*  CO2  --  23 20*  GLUCOSE  --  400* 463*  BUN  --  41* 41*  CREATININE 6.90* 7.01* 7.18*  CALCIUM  --   8.2* 8.6*  MG  --   --  2.4   Liver Function Tests:  Recent Labs Lab 12/22/15 0451  AST 36  ALT 35  ALKPHOS 202*  BILITOT 0.5  PROT 6.7  ALBUMIN 3.0*   No results for input(s): LIPASE, AMYLASE in the last 168 hours. No results for input(s): AMMONIA in the last 168 hours. CBC:  Recent Labs Lab 12/22/15 0451  WBC 10.2  NEUTROABS 8.2*  HGB 10.8*  HCT 34.2*  MCV 104.9*  PLT 185   Cardiac Enzymes:  Recent Labs Lab 12/22/15 0102 12/22/15 0451 12/22/15 0556 12/22/15 1221  TROPONINI 1.69* 1.37* 1.34* 1.19*   BNP (last 3 results) No results for input(s): BNP in the last 8760 hours.  ProBNP (last 3 results) No results for input(s): PROBNP in the last 8760 hours.  CBG:  Recent Labs Lab 12/22/15 1449 12/22/15 1541 12/22/15 2120 12/23/15 0346 12/23/15 0726  GLUCAP 48* 90 181* 339* 353*    Recent Results (from the past 240 hour(s))  MRSA PCR Screening     Status: None   Collection Time: 12/22/15  9:27 AM  Result Value Ref Range Status   MRSA by PCR NEGATIVE NEGATIVE Final    Comment:        The GeneXpert MRSA Assay (FDA approved for NASAL specimens only), is one component of a comprehensive MRSA colonization surveillance program. It is not  intended to diagnose MRSA infection nor to guide or monitor treatment for MRSA infections.      Studies: Dg Chest Port 1 View  12/23/2015  CLINICAL DATA:  Hypoxia EXAM: PORTABLE CHEST 1 VIEW COMPARISON:  Yesterday FINDINGS: The heart is moderately enlarged. The left base is obscured by an external object. Visualize lungs are grossly clear. Vascularity is unremarkable. Stable left jugular dialysis catheter with its tip in the lower SVC. IMPRESSION: Cardiomegaly without decompensation. Electronically Signed   By: Jolaine Click Connie.D.   On: 12/23/2015 07:52   Dg Chest Port 1 View  12/22/2015  CLINICAL DATA:  Chest pain, cough, shortness of breath. Dialysis dependent renal failure. EXAM: PORTABLE CHEST 1 VIEW COMPARISON:   PA and lateral chest x-ray of December 21, 2015 FINDINGS: The lungs are adequately inflated. There is no focal infiltrate the retrocardiac regions are clear today. There is no pleural effusion. The cardiac silhouette is mildly enlarged. The pulmonary vascularity is not engorged. The mediastinum is mildly prominent in width but stable. The dual-lumen dialysis type catheter tip projects over the midportion of the SVC. IMPRESSION: There is no evidence of pneumonia nor pulmonary edema. Stable mild cardiomegaly. The dialysis catheter is in reasonable position. Electronically Signed   By: David  [place] Connie.D.   On: 12/22/2015 07:43    Scheduled Meds: . antiseptic oral rinse  7 mL Mouth Rinse BID  . aspirin  325 mg Oral Daily  . calcitRIOL  0.25 mcg Oral Q T,Th,Sa-HD  . calcium carbonate  600 mg of elemental calcium Oral TID WC  .  ceFAZolin (ANCEF) IV  1 g Intravenous Q24H  . cinacalcet  60 mg Oral Q breakfast  . enoxaparin (LOVENOX) injection  30 mg Subcutaneous QHS  . felodipine  5 mg Oral BID  . insulin aspart  0-9 Units Subcutaneous TID WC  . insulin aspart protamine- aspart  15 Units Subcutaneous BID WC  . multivitamin  1 tablet Oral QHS  . sodium chloride  3 mL Intravenous Q12H   Continuous Infusions:  Antibiotics Given (last 72 hours)    Date/Time Action Medication Dose Rate   12/23/15 0111 Given   ceFAZolin (ANCEF) IVPB 1 g/50 mL premix 1 g 100 mL/hr      Principal Problem:   Hypoglycemia Active Problems:   ESRD on dialysis (HCC)   HTN (hypertension)   Anemia of renal disease   Elevated troponin   Nonspecific abnormal electrocardiogram (ECG) (EKG)   Bradycardia    Time spent: 25 min    Martha Ellerby U [facility]  [facility] Pager [phone]. If 7PM-7AM, please contact night-coverage at [email or web address], password [facility] 12/23/2015, 8:05 AM  LOS: 2 days

## 2015-12-23 NOTE — Progress Notes (Signed)
Patient: Nunzio CoryShirley M Dinger / Admit Date: 12/21/2015 / Date of Encounter: 12/23/2015, 6:45 AM   Subjective: Mother at bedside, reports she had a restless night and doesn't like using bipap - is finally resting after being given Ativan. She apparently had issues with profound sleep apnea overnight. Patient is arousable - denies CP or SOB.  Objective: Telemetry: NSR Physical Exam: Blood pressure 134/61, pulse 74, temperature 98.4 F (36.9 C), temperature source Oral, resp. rate 21, height 5\' 5"  (1.651 m), weight 179 lb 14.3 oz (81.6 kg), SpO2 95 %. General: Well developed, well nourished obese WF in no acute distress. Head: Normocephalic, atraumatic, sclera non-icteric, no xanthomas, nares are without discharge. Neck: Negative for carotid bruits. JVP not elevated. Lungs: Clear bilaterally to auscultation without wheezes, rales, or rhonchi. Breathing is unlabored. Heart: RRR S1 S2. Soft SEM ? Radiation from fistula. No rubs or gallops.  Abdomen: Soft, non-tender, non-distended with normoactive bowel sounds. No rebound/guarding. Extremities: No clubbing or cyanosis. No edema. Distal pedal pulses are 2+ and equal bilaterally. Neuro: Alert and oriented X 3. Moves all extremities spontaneously. Psych:  Sleepy but arousable, responds to questions appropriately.   Intake/Output Summary (Last 24 hours) at 12/23/15 0645 Last data filed at 12/23/15 0111  Gross per 24 hour  Intake     90 ml  Output   1000 ml  Net   -910 ml    Inpatient Medications:  . antiseptic oral rinse  7 mL Mouth Rinse BID  . aspirin  325 mg Oral Daily  . calcitRIOL  0.25 mcg Oral Q T,Th,Sa-HD  . calcium carbonate  600 mg of elemental calcium Oral TID WC  .  ceFAZolin (ANCEF) IV  1 g Intravenous Q24H  . cinacalcet  60 mg Oral Q breakfast  . enoxaparin (LOVENOX) injection  30 mg Subcutaneous QHS  . felodipine  5 mg Oral BID  . insulin aspart  0-9 Units Subcutaneous TID WC  . insulin aspart protamine- aspart  10 Units  Subcutaneous BID WC  . multivitamin  1 tablet Oral QHS  . sodium chloride  3 mL Intravenous Q12H   Infusions:    Labs:  Recent Labs  12/22/15 0451 12/22/15 0556  NA 128* 128*  K 3.6 3.7  CL 92* 90*  CO2 23 20*  GLUCOSE 400* 463*  BUN 41* 41*  CREATININE 7.01* 7.18*  CALCIUM 8.2* 8.6*  MG  --  2.4    Recent Labs  12/22/15 0451  AST 36  ALT 35  ALKPHOS 202*  BILITOT 0.5  PROT 6.7  ALBUMIN 3.0*    Recent Labs  12/22/15 0451  WBC 10.2  NEUTROABS 8.2*  HGB 10.8*  HCT 34.2*  MCV 104.9*  PLT 185    Recent Labs  12/22/15 0102 12/22/15 0451 12/22/15 0556 12/22/15 1221  TROPONINI 1.69* 1.37* 1.34* 1.19*   Invalid input(s): POCBNP No results for input(s): HGBA1C in the last 72 hours.   Radiology/Studies:  Dg Chest Port 1 View  12/22/2015  CLINICAL DATA:  Chest pain, cough, shortness of breath. Dialysis dependent renal failure. EXAM: PORTABLE CHEST 1 VIEW COMPARISON:  PA and lateral chest x-ray of December 21, 2015 FINDINGS: The lungs are adequately inflated. There is no focal infiltrate the retrocardiac regions are clear today. There is no pleural effusion. The cardiac silhouette is mildly enlarged. The pulmonary vascularity is not engorged. The mediastinum is mildly prominent in width but stable. The dual-lumen dialysis type catheter tip projects over the midportion of the SVC. IMPRESSION: There  is no evidence of pneumonia nor pulmonary edema. Stable mild cardiomegaly. The dialysis catheter is in reasonable position. Electronically Signed   By: David  [place] [name]   On: 12/22/2015 07:43     Assessment and Plan  22F with ESRD since 2007, HTN, DM, anemia of chronic disease, blindness right eye and no prior cardiac history was transferred from [facility] for altered mental status, hypoglycemia (23), and elevated troponin in the absence of cardiac symptoms. AM of 12/22/15 she had episode of bradycardia with HR transiently in the 30s (~73min) - when checked on, she  was found to have agonal breathing and unresponsiveness. Code was called but patient came to with jaw thrust and did not require CPR or meds. Peak troponin 1.69.  1. Altered mental status - initially occurred in the setting of hypoglycemia. CT head nonacute @ OSH. More accurate with A+O questions this AM. Further eval per IM.  2. Elevated troponin - troponin is felt nonspecific given complete lack of cardiac symptoms. Her presentation is not really consistent with classic acute coronary syndrome. In absence of chest pain, dyspnea or any other cardiac complaints, she was not started on heparin. Continue aspirin. 2D echo pending. She likely needs further ischemic workup at some point given multiple cardiac risk factors but this is not felt to be urgent at this time. Yesterday we recommended consideration of further neurologic workup. With frequent desaturations noted, ?evaluation for PE.  3. Bradycardia with respiratory event - hard to know if respiratory event triggered bradycardia or the other way around. ? Triggered by severe sleep apnea. She apparently bit her tongue with subsequent laceration so seizure is also a consideration. Avoid AVN blocking agents. Thyroid function normal. No further events on telemetry.  4. Diabetes mellitus - adjustment of regimen per IM. Wide fluctuations noted in CBG here (48->426).  Signed, Ronie Spies PA-C Pager: [phone]   Patient seen and examined and history reviewed. Agree with above findings and plan. Much more alert today. No cardiac symptoms. Noted to have severe sleep apnea during the night. Mild troponin elevation with flat curve not consistent with ACS. No clinical symptoms of angina. Ecg is benign. Awaiting Echo. If EF is normal no further cardiac work up needed this admission.  Peter [place], MDFACC 12/23/2015 8:58 AM

## 2015-12-24 LAB — RENAL FUNCTION PANEL
Albumin: 2.7 g/dL — ABNORMAL LOW (ref 3.5–5.0)
Anion gap: 15 (ref 5–15)
BUN: 22 mg/dL — ABNORMAL HIGH (ref 6–20)
CALCIUM: 8.3 mg/dL — AB (ref 8.9–10.3)
CHLORIDE: 97 mmol/L — AB (ref 101–111)
CO2: 22 mmol/L (ref 22–32)
CREATININE: 4.08 mg/dL — AB (ref 0.44–1.00)
GFR, EST AFRICAN AMERICAN: 13 mL/min — AB (ref >60)
GFR, EST NON AFRICAN AMERICAN: 11 mL/min — AB (ref >60)
Glucose, Bld: 430 mg/dL — ABNORMAL HIGH (ref 65–99)
Phosphorus: 3.9 mg/dL (ref 2.5–4.6)
Potassium: 4.3 mmol/L (ref 3.5–5.1)
SODIUM: 134 mmol/L — AB (ref 135–145)

## 2015-12-24 LAB — CBC
HCT: 35.1 % — ABNORMAL LOW (ref 36.0–46.0)
Hemoglobin: 10.8 g/dL — ABNORMAL LOW (ref 12.0–15.0)
MCH: 33 pg (ref 26.0–34.0)
MCHC: 30.8 g/dL (ref 30.0–36.0)
MCV: 107.3 fL — AB (ref 78.0–100.0)
PLATELETS: 173 10*3/uL (ref 150–400)
RBC: 3.27 MIL/uL — AB (ref 3.87–5.11)
RDW: 15.2 % (ref 11.5–15.5)
WBC: 7.3 10*3/uL (ref 4.0–10.5)

## 2015-12-24 LAB — BASIC METABOLIC PANEL
Anion gap: 12 (ref 5–15)
BUN: 19 mg/dL (ref 6–20)
CALCIUM: 7.9 mg/dL — AB (ref 8.9–10.3)
CHLORIDE: 97 mmol/L — AB (ref 101–111)
CO2: 25 mmol/L (ref 22–32)
CREATININE: 3.69 mg/dL — AB (ref 0.44–1.00)
GFR calc non Af Amer: 12 mL/min — ABNORMAL LOW (ref >60)
GFR, EST AFRICAN AMERICAN: 14 mL/min — AB (ref >60)
Glucose, Bld: 413 mg/dL — ABNORMAL HIGH (ref 65–99)
Potassium: 4 mmol/L (ref 3.5–5.1)
Sodium: 134 mmol/L — ABNORMAL LOW (ref 135–145)

## 2015-12-24 LAB — GLUCOSE, CAPILLARY
GLUCOSE-CAPILLARY: 408 mg/dL — AB (ref 65–99)
GLUCOSE-CAPILLARY: 346 mg/dL — AB (ref 65–99)
Glucose-Capillary: 316 mg/dL — ABNORMAL HIGH (ref 65–99)
Glucose-Capillary: 194 mg/dL — ABNORMAL HIGH (ref 65–99)

## 2015-12-24 MED ORDER — ALTEPLASE 2 MG IJ SOLR
2.0000 mg | Freq: Once | INTRAMUSCULAR | Status: DC | PRN
  Filled 2015-12-24: qty 2

## 2015-12-24 MED ORDER — HEPARIN SODIUM (PORCINE) 1000 UNIT/ML DIALYSIS: 2000.0000 [IU] | Freq: Once | INTRAMUSCULAR | Status: DC

## 2015-12-24 MED ORDER — SODIUM CHLORIDE 0.9 % IV SOLN: 100.0000 mL | INTRAVENOUS | Status: DC | PRN

## 2015-12-24 MED ORDER — PENTAFLUOROPROP-TETRAFLUOROETH EX AERO: 1.0000 "application " | INHALATION_SPRAY | CUTANEOUS | Status: DC | PRN

## 2015-12-24 MED ORDER — LIDOCAINE HCL (PF) 1 % IJ SOLN
5.0000 mL | INTRAMUSCULAR | Status: DC | PRN
  Filled 2015-12-24: qty 5

## 2015-12-24 MED ORDER — LIDOCAINE-PRILOCAINE 2.5-2.5 % EX CREA: 1.0000 "application " | TOPICAL_CREAM | CUTANEOUS | Status: DC | PRN

## 2015-12-24 MED ORDER — POLYETHYLENE GLYCOL 3350 17 G PO PACK
17.0000 g | PACK | Freq: Every day | ORAL | Status: DC
  Administered 2015-12-24 – 2015-12-25 (×2): 17 g via ORAL
  Filled 2015-12-24 (×2): qty 1

## 2015-12-24 MED ORDER — HEPARIN SODIUM (PORCINE) 1000 UNIT/ML DIALYSIS
1000.0000 [IU] | INTRAMUSCULAR | Status: DC | PRN
  Filled 2015-12-24: qty 1

## 2015-12-24 MED ORDER — OXYMETAZOLINE HCL 0.05 % NA SOLN
1.0000 | Freq: Two times a day (BID) | NASAL | Status: DC
  Administered 2015-12-24 – 2015-12-25 (×2): 1 via NASAL
  Filled 2015-12-24: qty 15

## 2015-12-24 NOTE — Evaluation (Signed)
Physical Therapy Evaluation Patient Details Name: Connie CoryShirley M Herdt MRN: [id number] DOB: 1956-01-09 Today's Date: 12/24/2015   History of Present Illness  Pt is a 60 y/o female with a PMH of ESRD on HD ([facility]), DMII, HTN, diabetic retinopathy (legally blind). [facility] presenting via EMS from dialysis center for AMS. Pt was found to be hypoglycemic and troponin was elevated to 1.04. Cardiology was consulted who felt elevated troponin was nonspecific.   Clinical Impression  Pt admitted with above diagnosis. Pt currently with functional limitations due to the deficits listed below (see PT Problem List). At the time of PT eval pt was able to perform transfers and ambulation with min to mod assist for safety, balance support, and management of walker. Mother present at end of session, however gave little insight to pt's baseline of function or cognitive baseline. Pt will benefit from skilled PT to increase their independence and safety with mobility to allow discharge to the venue listed below. If pt does not show functional improvement in following therapy sessions, short term rehab at the SNF level may be more appropriate.      Follow Up Recommendations Home health PT;Supervision for mobility/OOB    Equipment Recommendations  None recommended by PT    Recommendations for Other Services       Precautions / Restrictions Precautions Precautions: Fall Precaution Comments: Blind in R eye Restrictions Weight Bearing Restrictions: No      Mobility  Bed Mobility Overal bed mobility: Needs Assistance Bed Mobility: Supine to Sit     Supine to sit: Min guard     General bed mobility comments: Heavy use of rails for support as pt transitioned to full sitting position at EOB.   Transfers Overall transfer level: Needs assistance Equipment used: Rolling walker (2 wheeled) Transfers: Sit to/from Stand Sit to Stand: Mod assist         General transfer  comment: Mod assist to power-up to full standing position. Pt with proper hand placement on seated surface for safety.   Ambulation/Gait Ambulation/Gait assistance: Min assist Ambulation Distance (Feet): 60 Feet Assistive device: Rolling walker (2 wheeled) Gait Pattern/deviations: Decreased stride length;Wide base of support;Shuffle Gait velocity: Decreased Gait velocity interpretation: Below normal speed for age/gender General Gait Details: Pt with wide BOS and almost a waddle gait pattern. VC's to stay inside the walker and bring feet closer together (kicking the walker leg with L foot with each step). Pt was able to make corrective changes and maintain for a short time but reverted back to original gait pattern within 5 seconds.   Stairs            Wheelchair Mobility    Modified Rankin (Stroke Patients Only)       Balance Overall balance assessment: Needs assistance Sitting-balance support: Feet supported;Bilateral upper extremity supported Sitting balance-Leahy Scale: Fair Sitting balance - Comments: fair to sit statically. Pt required total assist to don shoes.   Standing balance support: Bilateral upper extremity supported;During functional activity Standing balance-Leahy Scale: Poor                               Pertinent Vitals/Pain Pain Assessment: No/denies pain    Home Living Family/patient expects to be discharged to:: Private residence Living Arrangements: Parent Available Help at Discharge: Family;Available 24 hours/day Type of Home: House Home Access: Stairs to enter   [employer] CorporationEntrance Stairs-Number of Steps: 2 Home Layout: One level Home  Equipment: Dan Humphreys - 2 wheels;Wheelchair - manual Additional Comments: Unsure how accurate this information is. Pt's mother not present during history questioning. Pt appeared "off" at times but difficult to assess. At end [facility] session mom entered and was questioned but gave no indication of whether pt was at her  baseline or if she is altered. Changed the subject and stated "it would take anyone time to get better. She just needs time."    Prior Function Level of Independence: Independent with assistive device(s)         Comments: Used RW all the time. Mother supervised some aspects of ADL's however pt could mostly perform on her own.      Hand Dominance        Extremity/Trunk Assessment   Upper Extremity Assessment: Defer to OT evaluation           Lower Extremity Assessment: Generalized weakness      Cervical / Trunk Assessment: Other exceptions  Communication   Communication: No difficulties  Cognition Arousal/Alertness: Awake/alert Behavior During Therapy: WFL for tasks assessed/performed Overall Cognitive Status: Difficult to assess                      General Comments      Exercises        Assessment/Plan    PT Assessment Patient needs continued PT services  PT Diagnosis Difficulty walking   PT Problem List Decreased strength;Decreased range of motion;Decreased activity tolerance;Decreased balance;Decreased mobility;Decreased knowledge of use of DME;Decreased safety awareness;Decreased knowledge of precautions;Cardiopulmonary status limiting activity  PT Treatment Interventions DME instruction;Gait training;Stair training;Functional mobility training;Therapeutic activities;Therapeutic exercise;Neuromuscular re-education;Patient/family education   PT Goals (Current goals can be found in the Care Plan section) Acute Rehab PT Goals Patient Stated Goal: Pt did not state goals PT Goal Formulation: With patient/family Time For Goal Achievement: 12/31/15 Potential to Achieve Goals: Good    Frequency Min 3X/week   Barriers to discharge        Co-evaluation               End of Session Equipment Utilized During Treatment: Gait belt Activity Tolerance: Patient limited by fatigue Patient left: in bed;with call bell/phone within reach;with  family/visitor present;with bed alarm set (Sitting EOB) Nurse Communication: Mobility status         Time: 1415-1440 PT Time Calculation (min) (ACUTE ONLY): 25 min   Charges:   PT Evaluation $PT Eval High Complexity: 1 Procedure PT Treatments $Gait Training: 8-22 mins   PT G Codes:        Conni Slipper January 06, 2016, 3:12 PM   Conni Slipper, PT, DPT Acute Rehabilitation Services Pager: [phone]

## 2015-12-24 NOTE — Progress Notes (Signed)
PROGRESS NOTE  Connie Christian ZOX:[id number] DOB: [birth year] DOA: 12/21/2015 PCP: No primary care provider on file.  Assessment/Plan: Hypoglycemia Resume diet Resume 70/30 at a lower dose- did not get last PM dose so will not adjust for high blood sugar this AM-- use SSI and Continue CBG monitoring.   Elevated troponin  Nonspecific abnormal electrocardiogram (ECG) (EKG) The patient denies any chest pain, dyspnea, dizziness, diaphoresis or palpitations  echocardiogramok Appreciate cards consult- no further intervention  Acute respiratory failure -wean O2 as tolerated -afrin to dry post nasal gtt Incentive spirometry -no sign of PNA/fluid on x ray    ESRD on dialysis [facility]) Continue hemodialysis as per schedule. T/Th/Sat Notified Dr. Arlean Hopping  Fever <100 -blood cultures ordered- NGTD   HTN (hypertension) Continue Plendil 10 mg by mouth daily. Monitor blood pressure.   Anemia of renal disease Monitor hematocrit and hemoglobin. Erythropoietin supplementation per nephrology.   ?OSA -sleep study arranged for outpatient And CPAP arranged for home use -patient does not like but had long discussion with mom and patient about why it is needed  Code Status: full Family Communication: patient Disposition Plan: tx to floor: home with mom once off O2   Consultants:  Cards  renal  Procedures:      HPI/Subjective: Did not sleep well on CPAP  Objective: Filed Vitals:   12/24/15 0134 12/24/15 0512  BP:  141/55  Pulse: 73 73  Temp:  97.9 F (36.6 C)  Resp: 19 26    Intake/Output Summary (Last 24 hours) at 12/24/15 0807 Last data filed at 12/24/15 0500  Gross per 24 hour  Intake     50 ml  Output      0 ml  Net     50 ml   Filed Weights   12/23/15 0345 12/23/15 1924 12/23/15 2259  Weight: 81.6 kg (179 lb 14.3 oz) 82.1 kg (181 lb) 82 kg (180 lb 12.4 oz)    Exam:   General:  Coughing-- says nose is "stuffy"  Cardiovascular:  rrr  Respiratory: no wheezing- not moving much air  Abdomen: +BS, soft  Musculoskeletal: no edema- chronic skin changes LE  Data Reviewed: Basic Metabolic Panel:  Recent Labs Lab 12/22/15 0102 12/22/15 0451 12/22/15 0556 12/23/15 2001 12/24/15 0506  NA  --  128* 128* 134* 134*  K  --  3.6 3.7 4.0 4.0  CL  --  92* 90* 94* 97*  CO2  --  23 20* 27 25  GLUCOSE  --  400* 463* 297* 413*  BUN  --  41* 41* 38* 19  CREATININE 6.90* 7.01* 7.18* 6.07* 3.69*  CALCIUM  --  8.2* 8.6* 8.1* 7.9*  MG  --   --  2.4  --   --   PHOS  --   --   --  5.0*  --    Liver Function Tests:  Recent Labs Lab 12/22/15 0451 12/23/15 2001  AST 36  --   ALT 35  --   ALKPHOS 202*  --   BILITOT 0.5  --   PROT 6.7  --   ALBUMIN 3.0* 2.6*   No results for input(s): LIPASE, AMYLASE in the last 168 hours. No results for input(s): AMMONIA in the last 168 hours. CBC:  Recent Labs Lab 12/22/15 0451 12/23/15 2003 12/24/15 0506  WBC 10.2 9.1 7.3  NEUTROABS 8.2*  --   --   HGB 10.8* 10.2* 10.8*  HCT 34.2* 33.1* 35.1*  MCV 104.9* 105.1* 107.3*  PLT 185  189 173   Cardiac Enzymes:  Recent Labs Lab 12/22/15 0102 12/22/15 0451 12/22/15 0556 12/22/15 1221  TROPONINI 1.69* 1.37* 1.34* 1.19*   BNP (last 3 results) No results for input(s): BNP in the last 8760 hours.  ProBNP (last 3 results) No results for input(s): PROBNP in the last 8760 hours.  CBG:  Recent Labs Lab 12/23/15 0346 12/23/15 0726 12/23/15 1203 12/23/15 1658 12/23/15 2239  GLUCAP 339* 353* 241* 175* 162*    Recent Results (from the past 240 hour(s))  MRSA PCR Screening     Status: None   Collection Time: 12/22/15  9:27 AM  Result Value Ref Range Status   MRSA by PCR NEGATIVE NEGATIVE Final    Comment:        The GeneXpert MRSA Assay (FDA approved for NASAL specimens only), is one component of a comprehensive MRSA colonization surveillance program. It is not intended to diagnose MRSA infection nor to guide  or monitor treatment for MRSA infections.   Culture, blood (routine x 2)     Status: None (Preliminary result)   Collection Time: 12/22/15  4:30 PM  Result Value Ref Range Status   Specimen Description BLOOD HEMODIALYSIS CATHETER  Final   Special Requests BOTTLES DRAWN AEROBIC AND ANAEROBIC 10CC  Final   Culture NO GROWTH < 24 HOURS  Final   Report Status PENDING  Incomplete  Culture, blood (routine x 2)     Status: None (Preliminary result)   Collection Time: 12/22/15  4:30 PM  Result Value Ref Range Status   Specimen Description BLOOD HEMODIALYSIS CATHETER  Final   Special Requests BOTTLES DRAWN AEROBIC AND ANAEROBIC 10CC  Final   Culture NO GROWTH < 24 HOURS  Final   Report Status PENDING  Incomplete     Studies: Dg Chest Port 1 View  12/23/2015  CLINICAL DATA:  Hypoxia EXAM: PORTABLE CHEST 1 VIEW COMPARISON:  Yesterday FINDINGS: The heart is moderately enlarged. The left base is obscured by an external object. Visualize lungs are grossly clear. Vascularity is unremarkable. Stable left jugular dialysis catheter with its tip in the lower SVC. IMPRESSION: Cardiomegaly without decompensation. Electronically Signed   By: Jolaine ClickArthur  Hoss [name]   On: 12/23/2015 07:52    Scheduled Meds: . antiseptic oral rinse  7 mL Mouth Rinse BID  . aspirin  325 mg Oral Daily  . calcitRIOL  0.25 mcg Oral Q T,Th,Sa-HD  . calcium carbonate  600 mg of elemental calcium Oral TID WC  .  ceFAZolin (ANCEF) IV  1 g Intravenous Q24H  . cinacalcet  60 mg Oral Q breakfast  . enoxaparin (LOVENOX) injection  30 mg Subcutaneous QHS  . felodipine  5 mg Oral BID  . heparin  2,000 Units Dialysis Once in dialysis  . insulin aspart  0-9 Units Subcutaneous TID WC  . insulin aspart protamine- aspart  12 Units Subcutaneous BID WC  . multivitamin  1 tablet Oral QHS  . oxymetazoline  1 spray Each Nare BID  . sodium chloride  3 mL Intravenous Q12H   Continuous Infusions:  Antibiotics Given (last 72 hours)    Date/Time  Action Medication Dose Rate   12/23/15 0111 Given   ceFAZolin (ANCEF) IVPB 1 g/50 mL premix 1 g 100 mL/hr   12/23/15 2243 Given  [given  on HD]   ceFAZolin (ANCEF) IVPB 1 g/50 mL premix 1 g 100 mL/hr      Principal Problem:   Hypoglycemia Active Problems:   ESRD on dialysis (HCC)  HTN (hypertension)   Anemia of renal disease   Elevated troponin   Nonspecific abnormal electrocardiogram (ECG) (EKG)   Bradycardia   OSA (obstructive sleep apnea)   Altered mental status    Time spent: 25 min    Ronesha Heenan [facility]  [facility] Pager [phone]. If 7PM-7AM, please contact night-coverage at [email or web address], password [name] 12/24/2015, 8:07 AM  LOS: 3 days

## 2015-12-24 NOTE — Care Management Important Message (Signed)
Important Message  Patient Details  Name: Connie Christian MRN: [id number] Date of Birth: [birth year]   Medicare Important Message Given:  Yes    Kyla BalzarineShealy, Johnell Bas Abena 12/24/2015, 11:02 AM

## 2015-12-24 NOTE — Progress Notes (Signed)
[facility] Progress Note  Assessment/Plan: 1. Hypoglycemia -BS high now insulin resumed at lower dose + SSI; eating well 2. ESRD - TTS -next HD Saturday K 4.3 - first round in case she can be d/c Sat 3. Hypertension/volume - BP variable - UF 1L on Wed , kept even Thursday when back on schedule- she can stand for wts 4. Anemia - Hgb 10.8 stable -ESA due for redose 1/17;  5. Metabolic bone disease - Continue low dose calcitriol/sensipar/binders 6. Nutrition - diet started - add nepro 7. Febrile illness- fevers low grade BC no growth, CXR neg PNA, on empiric Ancef  Sheffield SliderMartha B Bergman, PA-C [facility] Beeper [phone] 12/24/2015,3:21 PM  LOS: 3 days   Pt seen, examined and agree w A/P as above. Requiring oxygen now, at dry wt and CXR read as clear but looks suspicious for IS edema possibly. Will try UF w HD tomorrow, see if SaO2 improves.  Vinson Moselleob Aras Albarran MD [facility] pager [phone]    cell [phone] 12/24/2015, 5:34 PM    Subjective:   No c/o - doesn't wear O2 at home; mother says she has been up with walker   Objective Filed Vitals:   12/24/15 0134 12/24/15 0512 12/24/15 0751 12/24/15 1126  BP:  141/55 143/64 98/80  Pulse: 73 73 72 66  Temp:  97.9 F (36.6 C) 98.4 F (36.9 C)   TempSrc:  Axillary Oral   Resp: 19 26 25 21   Height:      Weight:      SpO2: 96% 95% 94% 95%   Physical Exam General: NAD  Heart: RRR 2/6 murmur Lungs: no rales Abdomen: obese soft Extremities: no LE edema Dialysis Access: right IJ  Dialysis Orders: [facility] 3.75 hr 160 400/A 1.5 2 K 2 Ca  EDW 81.5 profile 4 heparin 2000 Mircera 75 q 4 weeks last dose 12/20 calcitriiol 0.25 Recent labs: Hgb 10.6 stable 41% sat Ca 10.2 corr P 6 only takes tums - can't tolerate other binders iPTH 305  Additional Objective Labs: Basic Metabolic Panel:  Recent Labs Lab 12/23/15 2001 12/24/15 0506 12/24/15 0855  NA 134* 134* 134*  K 4.0 4.0 4.3  CL 94*  97* 97*  CO2 27 25 22   GLUCOSE 297* 413* 430*  BUN 38* 19 22*  CREATININE 6.07* 3.69* 4.08*  CALCIUM 8.1* 7.9* 8.3*  PHOS 5.0*  --  3.9   Liver Function Tests:  Recent Labs Lab 12/22/15 0451 12/23/15 2001 12/24/15 0855  AST 36  --   --   ALT 35  --   --   ALKPHOS 202*  --   --   BILITOT 0.5  --   --   PROT 6.7  --   --   ALBUMIN 3.0* 2.6* 2.7*   CBC:  Recent Labs Lab 12/22/15 0451 12/23/15 2003 12/24/15 0506  WBC 10.2 9.1 7.3  NEUTROABS 8.2*  --   --   HGB 10.8* 10.2* 10.8*  HCT 34.2* 33.1* 35.1*  MCV 104.9* 105.1* 107.3*  PLT 185 189 173   Blood Culture    Component Value Date/Time   SDES BLOOD HEMODIALYSIS CATHETER 12/22/2015 1630   SDES BLOOD HEMODIALYSIS CATHETER 12/22/2015 1630   SPECREQUEST BOTTLES DRAWN AEROBIC AND ANAEROBIC 10CC 12/22/2015 1630   SPECREQUEST BOTTLES DRAWN AEROBIC AND ANAEROBIC 10CC 12/22/2015 1630   CULT NO GROWTH 2 DAYS 12/22/2015 1630   CULT NO GROWTH 2 DAYS 12/22/2015 1630   REPTSTATUS PENDING 12/22/2015 1630   REPTSTATUS  PENDING 12/22/2015 1630    Cardiac Enzymes:  Recent Labs Lab 12/22/15 0102 12/22/15 0451 12/22/15 0556 12/22/15 1221  TROPONINI 1.69* 1.37* 1.34* 1.19*   CBG:  Recent Labs Lab 12/23/15 1203 12/23/15 1658 12/23/15 2239 12/24/15 0751 12/24/15 1202  GLUCAP 241* 175* 162* 408* 346*    Studies/Results: Dg Chest Port 1 View  12/23/2015  CLINICAL DATA:  Hypoxia EXAM: PORTABLE CHEST 1 VIEW COMPARISON:  Yesterday FINDINGS: The heart is moderately enlarged. The left base is obscured by an external object. Visualize lungs are grossly clear. Vascularity is unremarkable. Stable left jugular dialysis catheter with its tip in the lower SVC. IMPRESSION: Cardiomegaly without decompensation. Electronically Signed   By: Jolaine Click [name]   On: 12/23/2015 07:52   Medications:   . antiseptic oral rinse  7 mL Mouth Rinse BID  . aspirin  325 mg Oral Daily  . calcitRIOL  0.25 mcg Oral Q T,Th,Sa-HD  . calcium  carbonate  600 mg of elemental calcium Oral TID WC  .  ceFAZolin (ANCEF) IV  1 g Intravenous Q24H  . cinacalcet  60 mg Oral Q breakfast  . enoxaparin (LOVENOX) injection  30 mg Subcutaneous QHS  . felodipine  5 mg Oral BID  . heparin  2,000 Units Dialysis Once in dialysis  . insulin aspart  0-9 Units Subcutaneous TID WC  . insulin aspart protamine- aspart  12 Units Subcutaneous BID WC  . multivitamin  1 tablet Oral QHS  . oxymetazoline  1 spray Each Nare BID  . polyethylene glycol  17 g Oral Daily  . sodium chloride  3 mL Intravenous Q12H

## 2015-12-25 DIAGNOSIS — D631 Anemia in chronic kidney disease: Secondary | ICD-10-CM

## 2015-12-25 LAB — RENAL FUNCTION PANEL
Albumin: 2.6 g/dL — ABNORMAL LOW (ref 3.5–5.0)
Anion gap: 11 (ref 5–15)
BUN: 43 mg/dL — ABNORMAL HIGH (ref 6–20)
CO2: 25 mmol/L (ref 22–32)
Calcium: 8 mg/dL — ABNORMAL LOW (ref 8.9–10.3)
Chloride: 94 mmol/L — ABNORMAL LOW (ref 101–111)
Creatinine, Ser: 5.89 mg/dL — ABNORMAL HIGH (ref 0.44–1.00)
GFR calc Af Amer: 8 mL/min — ABNORMAL LOW (ref >60)
GFR calc non Af Amer: 7 mL/min — ABNORMAL LOW (ref >60)
Glucose, Bld: 400 mg/dL — ABNORMAL HIGH (ref 65–99)
Phosphorus: 3.9 mg/dL (ref 2.5–4.6)
Potassium: 4.1 mmol/L (ref 3.5–5.1)
Sodium: 130 mmol/L — ABNORMAL LOW (ref 135–145)

## 2015-12-25 LAB — CBC
HCT: 33 % — ABNORMAL LOW (ref 36.0–46.0)
Hemoglobin: 10.2 g/dL — ABNORMAL LOW (ref 12.0–15.0)
MCH: 32.6 pg (ref 26.0–34.0)
MCHC: 30.9 g/dL (ref 30.0–36.0)
MCV: 105.4 fL — ABNORMAL HIGH (ref 78.0–100.0)
Platelets: 169 10*3/uL (ref 150–400)
RBC: 3.13 MIL/uL — ABNORMAL LOW (ref 3.87–5.11)
RDW: 14.9 % (ref 11.5–15.5)
WBC: 6.8 10*3/uL (ref 4.0–10.5)

## 2015-12-25 LAB — GLUCOSE, CAPILLARY
GLUCOSE-CAPILLARY: 323 mg/dL — AB (ref 65–99)
GLUCOSE-CAPILLARY: 83 mg/dL (ref 65–99)
GLUCOSE-CAPILLARY: 224 mg/dL — AB (ref 65–99)

## 2015-12-25 MED ORDER — OXYMETAZOLINE HCL 0.05 % NA SOLN: 1.0000 | Freq: Two times a day (BID) | NASAL | Status: AC

## 2015-12-25 MED ORDER — LIDOCAINE-PRILOCAINE 2.5-2.5 % EX CREA
1.0000 "application " | TOPICAL_CREAM | CUTANEOUS | Status: DC | PRN
  Filled 2015-12-25: qty 5

## 2015-12-25 MED ORDER — CALCITRIOL 0.25 MCG PO CAPS: 0.2500 ug | ORAL_CAPSULE | ORAL | Status: AC

## 2015-12-25 MED ORDER — LIDOCAINE HCL (PF) 1 % IJ SOLN
5.0000 mL | INTRAMUSCULAR | Status: DC | PRN
  Filled 2015-12-25: qty 5

## 2015-12-25 MED ORDER — SODIUM CHLORIDE 0.9 % IV SOLN: 100.0000 mL | INTRAVENOUS | Status: DC | PRN

## 2015-12-25 MED ORDER — ALTEPLASE 2 MG IJ SOLR
2.0000 mg | Freq: Once | INTRAMUSCULAR | Status: DC | PRN
  Filled 2015-12-25: qty 2

## 2015-12-25 MED ORDER — HEPARIN SODIUM (PORCINE) 1000 UNIT/ML DIALYSIS
1000.0000 [IU] | INTRAMUSCULAR | Status: DC | PRN
  Filled 2015-12-25: qty 1

## 2015-12-25 MED ORDER — CINACALCET HCL 30 MG PO TABS: 60.0000 mg | ORAL_TABLET | Freq: Every day | ORAL | Status: AC

## 2015-12-25 MED ORDER — PENTAFLUOROPROP-TETRAFLUOROETH EX AERO: 1.0000 "application " | INHALATION_SPRAY | CUTANEOUS | Status: DC | PRN

## 2015-12-25 MED ORDER — ASPIRIN 325 MG PO TABS: 325.0000 mg | ORAL_TABLET | Freq: Every day | ORAL | Status: AC

## 2015-12-25 MED ORDER — HEPARIN SODIUM (PORCINE) 1000 UNIT/ML DIALYSIS
20.0000 [IU]/kg | INTRAMUSCULAR | Status: DC | PRN
  Filled 2015-12-25: qty 2

## 2015-12-25 NOTE — Progress Notes (Signed)
PT is recommending HHPT. Met with pt and mother at bedside to discuss PT recommendations. Pt declined HHPT. Her mother reports that she walks with a walker at home. Pt reports that she exercises. Encouraged pt to let us know if she decides to have HHPT.

## 2015-12-25 NOTE — Progress Notes (Signed)
[facility] as well as CM

## 2015-12-25 NOTE — Discharge Summary (Signed)
Physician Discharge Summary  CHANNEL PAPANDREA MVH:[id number] DOB: 06-06-1956 DOA: 12/21/2015  PCP: No primary care provider on file.  Admit date: 12/21/2015 Discharge date: 12/25/2015  Time spent: 35 minutes  Recommendations for Outpatient Follow-up:  1. Sleep study arranged 2. Resume HD t/th/sat   Discharge Diagnoses:  Principal Problem:   Hypoglycemia Active Problems:   ESRD on dialysis (HCC)   HTN (hypertension)   Anemia of renal disease   Elevated troponin   Nonspecific abnormal electrocardiogram (ECG) (EKG)   Bradycardia   OSA (obstructive sleep apnea)   Altered mental status   Discharge Condition: improved  Diet recommendation: renal  Filed Weights   12/23/15 2259 12/25/15 0810 12/25/15 1148  Weight: 82 kg (180 lb 12.4 oz) 85 kg (187 lb 6.3 oz) 80.7 kg (177 lb 14.6 oz)    History of present illness:  Connie Christian is a 60 y.o. female with a medical history of ESRD on hemodialysis, type 2 diabetes, hypertension, diabetic retinopathy (patient is legally blind), anemia of renal disease who comes referred from [facility] after being taken there via EMS due to altered status from her dialysis center. Her CBG was checked and was found to be initially 23 mg/dL.  Per patient, she does not remember exactly what happened while she was having dialysis. She states that she was able to complete it. She thinks that her symptoms of hypoglycemia might be due to the fact that she use her insulin in the morning and was not able to eat anything during the day. These improve after the patient was given 2 ampules of the dextrose 50% and some snacks to eat.  Workup of [facility] ED revealed an elevated troponin of 1.04 and an abnormal EKG with inverted T waves on lateral leads. However, she denies chest pain, palpitations, dyspnea, diaphoresis, PND or orthopnea. She states that she gets pitting edema lower extremities and postural dizziness on occasion.   When seen, the patient was in  no acute distress. She stated that she was hungry. Denied any other complaints.   Hospital Course:  Hypoglycemia due to not eating Resume home meds   Elevated troponin  Nonspecific abnormal electrocardiogram (ECG) (EKG) The patient denies any chest pain, dyspnea, dizziness, diaphoresis or palpitations echocardiogramok Appreciate cards consult- no further intervention  Acute respiratory failure -wean O2 as tolerated -afrin to dry post nasal gtt Incentive spirometry -no sign of PNA/fluid on x ray    ESRD on dialysis [facility]) Continue hemodialysis as per schedule. T/Th/Sat  Fever <100 -blood cultures ordered- NGTD D/c abx   HTN (hypertension) Continue Plendil 10 mg by mouth daily. Monitor blood pressure.   Anemia of renal disease Monitor hematocrit and hemoglobin. Erythropoietin supplementation per nephrology.   OSA -sleep study arranged for outpatient And CPAP arranged for home use per care management but now being told that DME can not be given until after sleep study -patient does not like but had long discussion with mom and patient about why it is needed   Procedures:    Consultations:  nephrology  Discharge Exam: Filed Vitals:   12/25/15 1148 12/25/15 1344  BP: 121/78 120/57  Pulse: 60 75  Temp: 98.4 F (36.9 C) 98.5 F (36.9 C)  Resp: 20 20    General: awake- not requiring O2 Cardiovascular: rrr Respiratory: clear  Discharge Instructions   Discharge Instructions    Discharge instructions    Complete by:  As directed   Carb mod/renal     Increase activity slowly  Complete by:  As directed           Current Discharge Medication List    START taking these medications   Details  aspirin 325 MG tablet Take 1 tablet (325 mg total) by mouth daily. Qty: 30 tablet    calcitRIOL (ROCALTROL) 0.25 MCG capsule Take 1 capsule (0.25 mcg total) by mouth Every Tuesday,Thursday,and Saturday with dialysis. Qty: 10 capsule, Refills: 0     oxymetazoline (AFRIN) 0.05 % nasal spray Place 1 spray into both nostrils 2 (two) times daily. Qty: 30 mL, Refills: 0      CONTINUE these medications which have CHANGED   Details  cinacalcet (SENSIPAR) 30 MG tablet Take 2 tablets (60 mg total) by mouth daily with breakfast. Qty: 60 tablet, Refills: 0      CONTINUE these medications which have NOT CHANGED   Details  acetaminophen (TYLENOL) 500 MG tablet Take 500 mg by mouth every 6 (six) hours as needed for mild pain, moderate pain or headache.    calcium carbonate (TUMS - DOSED IN MG ELEMENTAL CALCIUM) 500 MG chewable tablet Chew 1 tablet by mouth 3 (three) times daily with meals.    felodipine (PLENDIL) 10 MG 24 hr tablet Take 5 mg by mouth 2 (two) times daily.    Insulin Isophane & Regular Human (HUMULIN 70/30 KWIKPEN) (70-30) 100 UNIT/ML PEN Inject 15 Units into the skin 2 (two) times daily.    multivitamin (RENA-VIT) TABS tablet Take 1 tablet by mouth at bedtime.    ondansetron (ZOFRAN) 4 MG tablet Take 4 mg by mouth every 8 (eight) hours as needed for nausea or vomiting.    atropine 1 % ophthalmic solution Place 1 drop into the right eye every 8 (eight) hours. Qty: 2 mL    Nutritional Supplements (FEEDING SUPPLEMENT, NEPRO CARB STEADY,) LIQD Take 237 mLs by mouth as needed (missed meal during dialysis.). Qty: 30 Can, Refills: 0    Skin Protectants, Misc. (EUCERIN) cream Apply 1 application topically as needed for dry skin.      STOP taking these medications     Multiple Vitamin (MULTIVITAMIN WITH MINERALS) TABS      gatifloxacin (ZYMAXID) 0.5 % SOLN      insulin NPH-regular (NOVOLIN 70/30) (70-30) 100 UNIT/ML injection      prednisoLONE acetate (PRED FORTE) 1 % ophthalmic suspension      tobramycin (TOBREX) 0.3 % ophthalmic solution        Allergies  Allergen Reactions  . Amoxicillin Itching and Rash  . Ciprofloxacin Itching and Rash  . Daptomycin Itching and Rash  . Iron Dextran     hypotension  .  Penicillins Itching and Rash    Has patient had a PCN reaction causing immediate rash, facial/tongue/throat swelling, SOB or lightheadedness with hypotension: No Has patient had a PCN reaction causing severe rash involving mucus membranes or skin necrosis: No Has patient had a PCN reaction that required hospitalization No Has patient had a PCN reaction occurring within the last 10 years: No If all of the above answers are "NO", then may proceed with Cephalosporin use.  . Vancomycin Itching, Rash and Other (See Comments)    Pt states that this medications caused her "skin to peel"   Follow-up Information    [facility] with [facility].   Why:  CPAP arranged and to be delivered at bedside   Contact information:   [address] [place] [address] [phone]  Follow up with [facility] On 01/03/2016.   Specialty:  Sleep Medicine   Why:  Sleep study arranged for 01/03/2016 at 8pm   Contact information:   [address] [place], [address] [id number] mc [place] [address] [phone]       The results of significant diagnostics from this hospitalization (including imaging, microbiology, ancillary and laboratory) are listed below for reference.    Significant Diagnostic Studies: Dg Chest Port 1 View  12/23/2015  CLINICAL DATA:  Hypoxia EXAM: PORTABLE CHEST 1 VIEW COMPARISON:  Yesterday FINDINGS: The heart is moderately enlarged. The left base is obscured by an external object. Visualize lungs are grossly clear. Vascularity is unremarkable. Stable left jugular dialysis catheter with its tip in the lower SVC. IMPRESSION: Cardiomegaly without decompensation. Electronically Signed   By: Jolaine Click [name]   On: 12/23/2015 07:52   Dg Chest Port 1 View  12/22/2015  CLINICAL DATA:  Chest pain, cough, shortness of breath. Dialysis dependent renal failure. EXAM: PORTABLE CHEST 1 VIEW COMPARISON:  PA and lateral chest x-ray of December 21, 2015 FINDINGS: The lungs are adequately inflated. There is no focal infiltrate the retrocardiac regions are clear today. There is no pleural effusion. The cardiac silhouette is mildly enlarged. The pulmonary vascularity is not engorged. The mediastinum is mildly prominent in width but stable. The dual-lumen dialysis type catheter tip projects over the midportion of the SVC. IMPRESSION: There is no evidence of pneumonia nor pulmonary edema. Stable mild cardiomegaly. The dialysis catheter is in reasonable position. Electronically Signed   By: David  [place] [name]   On: 12/22/2015 07:43    Microbiology: Recent Results (from the past 240 hour(s))  MRSA PCR Screening     Status: None   Collection Time: 12/22/15  9:27 AM  Result Value Ref Range Status   MRSA by PCR NEGATIVE NEGATIVE Final    Comment:        The GeneXpert MRSA Assay (FDA approved for NASAL specimens only), is one component of a comprehensive MRSA colonization surveillance program. It is not intended to diagnose MRSA infection nor to guide or monitor treatment for MRSA infections.   Culture, blood (routine x 2)     Status: None (Preliminary result)   Collection Time: 12/22/15  4:30 PM  Result Value Ref Range Status   Specimen Description BLOOD HEMODIALYSIS CATHETER  Final   Special Requests BOTTLES DRAWN AEROBIC AND ANAEROBIC 10CC  Final   Culture NO GROWTH 2 DAYS  Final   Report Status PENDING  Incomplete  Culture, blood (routine x 2)     Status: None (Preliminary result)   Collection Time: 12/22/15  4:30 PM  Result Value Ref Range Status   Specimen Description BLOOD HEMODIALYSIS CATHETER  Final   Special Requests BOTTLES DRAWN AEROBIC AND ANAEROBIC 10CC  Final   Culture NO GROWTH 2 DAYS  Final   Report Status PENDING  Incomplete     Labs: Basic Metabolic Panel:  Recent Labs Lab 12/22/15 0556 12/23/15 2001 12/24/15 0506 12/24/15 0855 12/25/15 0829  NA 128* 134* 134* 134* 130*  K 3.7 4.0 4.0 4.3 4.1  CL 90* 94*  97* 97* 94*  CO2 20* 27 25 22 25   GLUCOSE 463* 297* 413* 430* 400*  BUN 41* 38* 19 22* 43*  CREATININE 7.18* 6.07* 3.69* 4.08* 5.89*  CALCIUM 8.6* 8.1* 7.9* 8.3* 8.0*  MG 2.4  --   --   --   --   PHOS  --  5.0*  --  3.9 3.9   Liver Function Tests:  Recent Labs Lab 12/22/15 0451 12/23/15 2001 12/24/15 0855 12/25/15 0829  AST 36  --   --   --   ALT 35  --   --   --   ALKPHOS 202*  --   --   --   BILITOT 0.5  --   --   --   PROT 6.7  --   --   --   ALBUMIN 3.0* 2.6* 2.7* 2.6*   No results for input(s): LIPASE, AMYLASE in the last 168 hours. No results for input(s): AMMONIA in the last 168 hours. CBC:  Recent Labs Lab 12/22/15 0451 12/23/15 2003 12/24/15 0506 12/25/15 0830  WBC 10.2 9.1 7.3 6.8  NEUTROABS 8.2*  --   --   --   HGB 10.8* 10.2* 10.8* 10.2*  HCT 34.2* 33.1* 35.1* 33.0*  MCV 104.9* 105.1* 107.3* 105.4*  PLT 185 189 173 169   Cardiac Enzymes:  Recent Labs Lab 12/22/15 0102 12/22/15 0451 12/22/15 0556 12/22/15 1221  TROPONINI 1.69* 1.37* 1.34* 1.19*   BNP: BNP (last 3 results) No results for input(s): BNP in the last 8760 hours.  ProBNP (last 3 results) No results for input(s): PROBNP in the last 8760 hours.  CBG:  Recent Labs Lab 12/24/15 1202 12/24/15 1734 12/24/15 2141 12/25/15 0736 12/25/15 1225  GLUCAP 346* 316* 194* 323* 83       Signed:  Bernice Mullin U Keenya Matera  DO  [facility] 12/25/2015, 2:06 PM

## 2015-12-25 NOTE — Progress Notes (Signed)
   12/25/15 0145  BiPAP/CPAP/SIPAP  BiPAP/CPAP/SIPAP Pt Type Adult  Mask Type Nasal mask  Mask Size Medium  Respiratory Rate 20 breaths/min  IPAP 8 cmH20  EPAP 8 cmH2O  Flow Rate 3 lpm  BiPAP/CPAP/SIPAP CPAP  Patient Home Equipment No  Auto Titrate No  BiPAP/CPAP /SiPAP Vitals  Pulse Rate 72  Resp 20

## 2015-12-25 NOTE — Progress Notes (Signed)
Spoke w/ Dr Benjamine MolaVann ok to d/c pt.CM to set up for cpap and sleep study

## 2015-12-25 NOTE — Progress Notes (Signed)
[facility] Progress Note  Assessment/Plan: 1. Hypoglycemia -BS high now insulin resumed at lower dose + SSI; eating well 2. ESRD - TTS on HD, labs pending 3. Hypertension/volume - BP variable - UF 1L on Wed , kept even Thursday when back on schedule- Pre HD standing wt 85 - 3.5 above EDW .  BP variable - plan increase goal to 4 L which would take her to her EDW, though not sure BP can support; use profile 4, reduced temp to 36; need sats on room air after HD 4. Anemia - Hgb 10.8 stable -ESA due for redose 1/17; -CBC pending 5. Metabolic bone disease - Continue low dose calcitriol/sensipar/binders 6. Nutrition - diet started - add nepro alb 2.7 7. Febrile illness- fevers low grade BC no growth, CXR neg PNA, on empiric Ancef  Sheffield Slider, PA-C [facility] Beeper [phone] 12/25/2015,8:41 AM  LOS: 4 days   Pt seen, examined and agree w A/P as above. Attempting large UF today due to hypoxemia issues. Check RA sat after HD.  Vinson Moselle MD [facility] pager [phone]    cell [phone] 12/25/2015, 9:32 AM    Subjective:   States she never used CPAP or Bipap at home. Couldn't sleep well due to the noise and that mask on her face  Objective Filed Vitals:   12/25/15 0145 12/25/15 0559 12/25/15 0810 12/25/15 0813  BP:  162/67 125/66 152/62  Pulse: 72 69 71 72  Temp:  98.7 F (37.1 C) 98.5 F (36.9 C)   TempSrc:  Oral Oral   Resp: 20 16 18 19   Height:      Weight:   85 kg (187 lb 6.3 oz)   SpO2:  90% 94%    Physical Exam goal 3 L General: NAD on HD but dry cough, face a little puffy Heart: RRR 2/6 murmur Lungs: clear anteriorly Abdomen: obese soft Extremities: tr LE edema Dialysis Access: right IJ  Dialysis Orders: [facility] 3.75 hr 160 400/A 1.5 2 K 2 Ca EDW 81.5 profile 4 heparin 2000 Mircera 75 q 4 weeks last dose 12/20 calcitriiol 0.25 Recent labs: Hgb 10.6 stable 41% sat Ca 10.2 corr P 6 only takes tums - can't  tolerate other binders iPTH 305  Additional Objective Labs: Basic Metabolic Panel:  Recent Labs Lab 12/23/15 2001 12/24/15 0506 12/24/15 0855  NA 134* 134* 134*  K 4.0 4.0 4.3  CL 94* 97* 97*  CO2 27 25 22   GLUCOSE 297* 413* 430*  BUN 38* 19 22*  CREATININE 6.07* 3.69* 4.08*  CALCIUM 8.1* 7.9* 8.3*  PHOS 5.0*  --  3.9   Liver Function Tests:  Recent Labs Lab 12/22/15 0451 12/23/15 2001 12/24/15 0855  AST 36  --   --   ALT 35  --   --   ALKPHOS 202*  --   --   BILITOT 0.5  --   --   PROT 6.7  --   --   ALBUMIN 3.0* 2.6* 2.7*   CBC:  Recent Labs Lab 12/22/15 0451 12/23/15 2003 12/24/15 0506  WBC 10.2 9.1 7.3  NEUTROABS 8.2*  --   --   HGB 10.8* 10.2* 10.8*  HCT 34.2* 33.1* 35.1*  MCV 104.9* 105.1* 107.3*  PLT 185 189 173   Blood Culture    Component Value Date/Time   SDES BLOOD HEMODIALYSIS CATHETER 12/22/2015 1630   SDES BLOOD HEMODIALYSIS CATHETER 12/22/2015 1630   SPECREQUEST BOTTLES DRAWN AEROBIC AND ANAEROBIC 10CC 12/22/2015  1630   SPECREQUEST BOTTLES DRAWN AEROBIC AND ANAEROBIC 10CC 12/22/2015 1630   CULT NO GROWTH 2 DAYS 12/22/2015 1630   CULT NO GROWTH 2 DAYS 12/22/2015 1630   REPTSTATUS PENDING 12/22/2015 1630   REPTSTATUS PENDING 12/22/2015 1630    Cardiac Enzymes:  Recent Labs Lab 12/22/15 0102 12/22/15 0451 12/22/15 0556 12/22/15 1221  TROPONINI 1.69* 1.37* 1.34* 1.19*   CBG:  Recent Labs Lab 12/24/15 0751 12/24/15 1202 12/24/15 1734 12/24/15 2141 12/25/15 0736  GLUCAP 408* 346* 316* 194* 323*  Medications:   . antiseptic oral rinse  7 mL Mouth Rinse BID  . aspirin  325 mg Oral Daily  . calcitRIOL  0.25 mcg Oral Q T,Th,Sa-HD  . calcium carbonate  600 mg of elemental calcium Oral TID WC  .  ceFAZolin (ANCEF) IV  1 g Intravenous Q24H  . cinacalcet  60 mg Oral Q breakfast  . enoxaparin (LOVENOX) injection  30 mg Subcutaneous QHS  . heparin  2,000 Units Dialysis Once in dialysis  . insulin aspart  0-9 Units  Subcutaneous TID WC  . insulin aspart protamine- aspart  12 Units Subcutaneous BID WC  . multivitamin  1 tablet Oral QHS  . oxymetazoline  1 spray Each Nare BID  . polyethylene glycol  17 g Oral Daily  . sodium chloride  3 mL Intravenous Q12H

## 2015-12-25 NOTE — Progress Notes (Signed)
Report given to dialysis nurse.

## 2015-12-27 LAB — CULTURE, BLOOD (ROUTINE X 2)
Culture: NO GROWTH
Culture: NO GROWTH

## 2015-12-29 MED FILL — Medication: Qty: 1 | Status: AC

## 2016-01-03 ENCOUNTER — Encounter (HOSPITAL_BASED_OUTPATIENT_CLINIC_OR_DEPARTMENT_OTHER): Payer: Medicare Other

## 2016-03-11 DEATH — deceased

## 2016-10-15 IMAGING — CR DG CHEST 1V PORT
1 series · 1 of 1 positions shown · non-contrast
Comparison: Yesterday

CLINICAL DATA: Hypoxia

EXAM:
PORTABLE CHEST 1 VIEW

[AP]
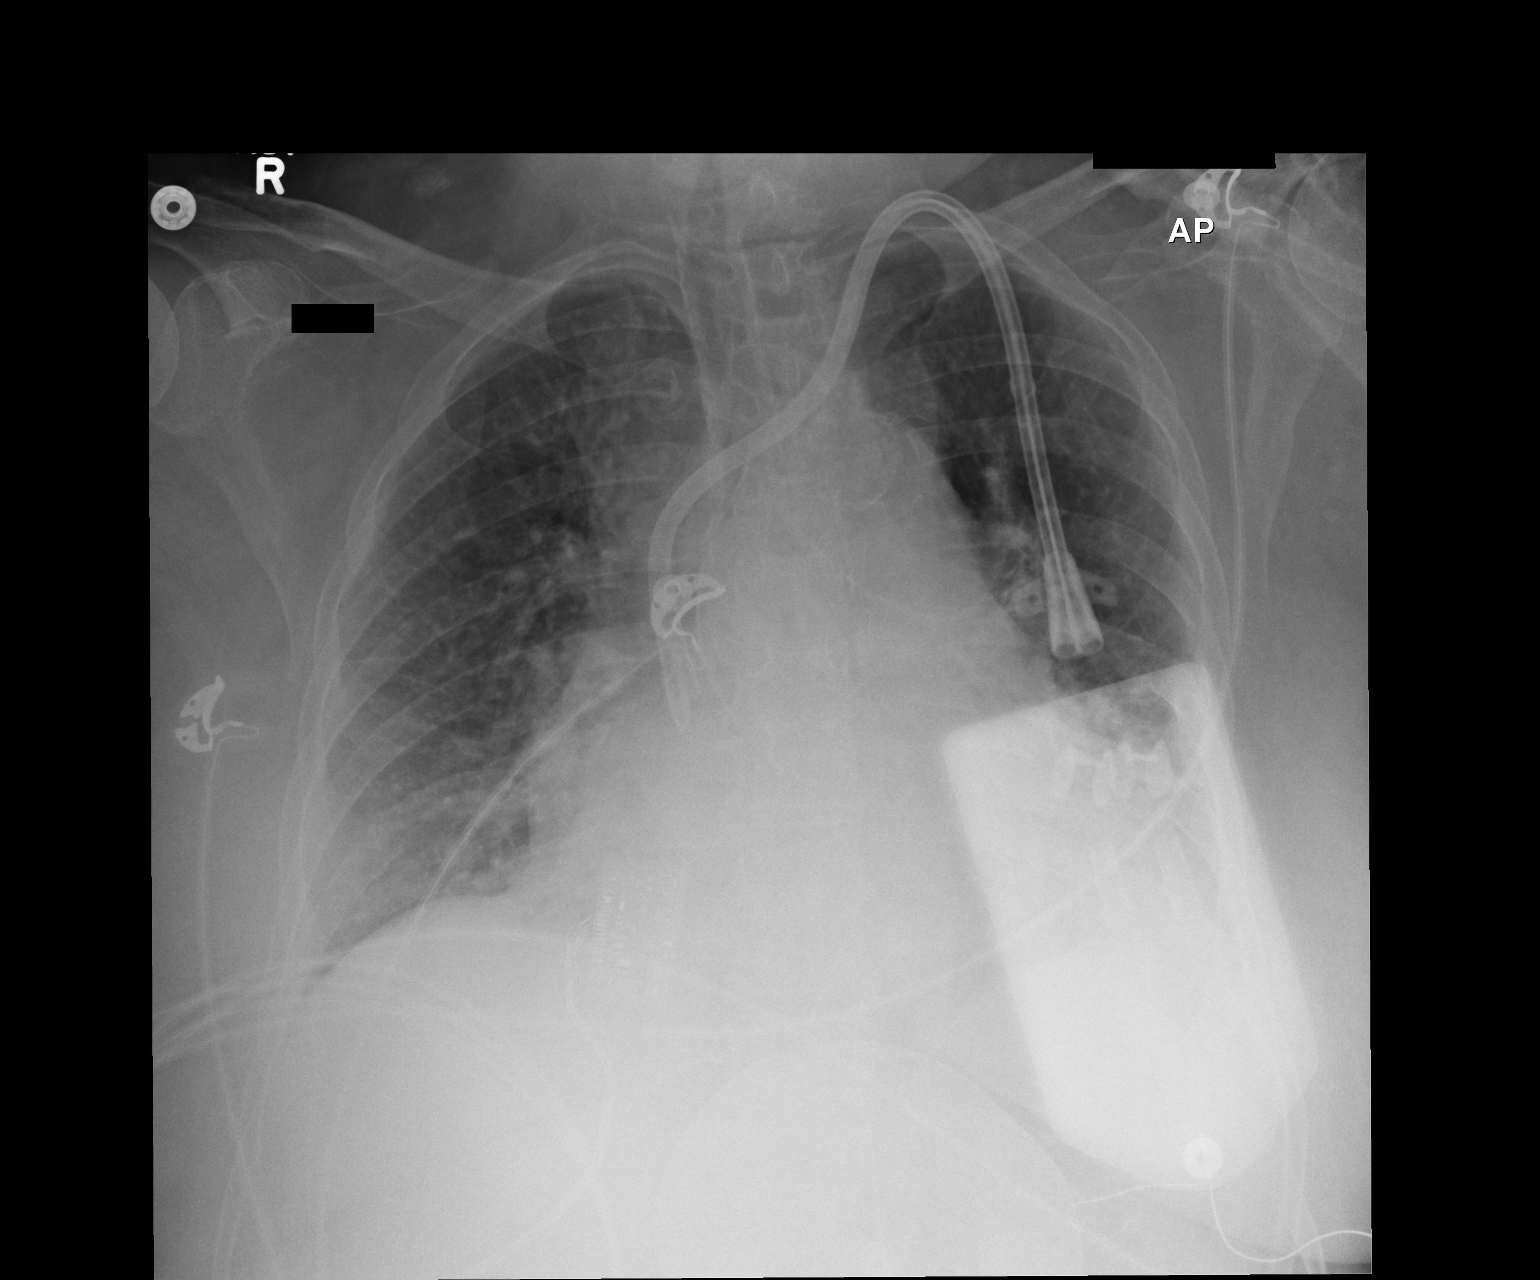

[1 of 1 positions shown; findings below may reference images not displayed]

FINDINGS: The heart is moderately enlarged. The left base is obscured by an
external object. Visualize lungs are grossly clear. Vascularity is
unremarkable. Stable left jugular dialysis catheter with its tip in
the lower SVC.
IMPRESSION: Cardiomegaly without decompensation.

## 2019-03-12 DEATH — deceased
